# Patient Record
Sex: Male | Born: 1955 | Race: Black or African American | Hispanic: No | Marital: Married | Smoking: Never smoker
Health system: Southern US, Community
[De-identification: ages and names within clinical notes are randomized; demographics above are authoritative.]

## PROBLEM LIST (undated history)

## (undated) DIAGNOSIS — F419 Anxiety disorder, unspecified: Secondary | ICD-10-CM

## (undated) DIAGNOSIS — H269 Unspecified cataract: Secondary | ICD-10-CM

## (undated) DIAGNOSIS — T7840XA Allergy, unspecified, initial encounter: Secondary | ICD-10-CM

## (undated) DIAGNOSIS — H8110 Benign paroxysmal vertigo, unspecified ear: Secondary | ICD-10-CM

## (undated) DIAGNOSIS — E785 Hyperlipidemia, unspecified: Secondary | ICD-10-CM

## (undated) HISTORY — DX: Unspecified cataract: H26.9

## (undated) HISTORY — DX: Allergy, unspecified, initial encounter: T78.40XA

## (undated) HISTORY — DX: Anxiety disorder, unspecified: F41.9

## (undated) HISTORY — PX: EYE SURGERY: SHX253

## (undated) HISTORY — DX: Hyperlipidemia, unspecified: E78.5

## (undated) HISTORY — PX: COLONOSCOPY: SHX174

## (undated) HISTORY — DX: Benign paroxysmal vertigo, unspecified ear: H81.10

---

## 2012-01-09 HISTORY — PX: COLONOSCOPY: SHX174

## 2012-08-20 LAB — HM COLONOSCOPY

## 2016-08-17 ENCOUNTER — Other Ambulatory Visit: Payer: Self-pay

## 2016-08-17 ENCOUNTER — Encounter: Payer: Self-pay | Admitting: Family Medicine

## 2016-08-29 ENCOUNTER — Ambulatory Visit (INDEPENDENT_AMBULATORY_CARE_PROVIDER_SITE_OTHER): Payer: 59 | Admitting: Family Medicine

## 2016-08-29 ENCOUNTER — Encounter: Payer: Self-pay | Admitting: Family Medicine

## 2016-08-29 VITALS — BP 124/80 | HR 65 | Resp 12 | Ht 71.75 in | Wt 178.2 lb

## 2016-08-29 DIAGNOSIS — N529 Male erectile dysfunction, unspecified: Secondary | ICD-10-CM | POA: Diagnosis not present

## 2016-08-29 DIAGNOSIS — L700 Acne vulgaris: Secondary | ICD-10-CM | POA: Diagnosis not present

## 2016-08-29 DIAGNOSIS — E785 Hyperlipidemia, unspecified: Secondary | ICD-10-CM | POA: Diagnosis not present

## 2016-08-29 DIAGNOSIS — F419 Anxiety disorder, unspecified: Secondary | ICD-10-CM

## 2016-08-29 DIAGNOSIS — R079 Chest pain, unspecified: Secondary | ICD-10-CM

## 2016-08-29 DIAGNOSIS — H8110 Benign paroxysmal vertigo, unspecified ear: Secondary | ICD-10-CM

## 2016-08-29 MED ORDER — DIAZEPAM 5 MG PO TABS: 5.0000 mg | ORAL_TABLET | Freq: Two times a day (BID) | ORAL | 0 refills | Status: DC | PRN

## 2016-08-29 MED ORDER — CLINDAMYCIN PHOS-BENZOYL PEROX 1-5 % EX GEL: 1.0000 "application " | CUTANEOUS | 1 refills | Status: DC | PRN

## 2016-08-29 NOTE — Progress Notes (Signed)
HPI:   Franklin Baker is a 60 y.o. male, who is here today to establish care.  Former PCP: N/A, he just moved from [place] a month ago.  Last preventive routine visit: 1-2 years ago, 04/2015.  Chronic medical problems: HLD,ED, anxiety,vertogo,allergic rhinitis.  He has Hx of vertigo and anxiety, he is on Valium 5 mg bid as needed (mainly for anxiety). He has been on this med for about 3 years. He was following with neuro in [place]. He has not had an episode of vertigo for a few months.  According to pt, he had extensive work-up done, including brain MRI and negative otherwise. He denies hearing loss or tinnitus.  Vit D deficiency, he is on Vit D 2000 U daily.  "Ingrown hair bumps", has had problem intermittently for years. He usse topical abx as needed.  ED: He has already established with urologists in town.  According to pt, he was in the ER recently c/o chest pain (06/2016). Pain on right-sided chest started about 3 days after helping his brother to move, lifted heavy furniture. Denies any trauma. No associated dyspnea,diaphoresis, or palpitation. Pain was not radiated.  He stopped lifting wts, pain has resolved. He wonders if he can start exercising again.  HLD:  Currently he is on Zocor 40 mg daily. Tolerating medication well. He follows a healthy diet.  Last lab work in 06/2016 per pt report.  Concerns today: Med refills.   Review of Systems  Constitutional: Negative for activity change, appetite change, fatigue, fever and unexpected weight change.  HENT: Negative for congestion, hearing loss, nosebleeds, sore throat, tinnitus and trouble swallowing.   Eyes: Negative for redness and visual disturbance.  Respiratory: Negative for cough, shortness of breath and wheezing.   Cardiovascular: Negative for chest pain, palpitations and leg swelling.  Gastrointestinal: Negative for abdominal pain, nausea and vomiting.  Endocrine: Negative for polydipsia, polyphagia and  polyuria.  Genitourinary: Negative for decreased urine volume, dysuria and hematuria.  Musculoskeletal: Negative for back pain and gait problem.  Skin: Negative for pallor and rash.  Allergic/Immunologic: Positive for environmental allergies.  Neurological: Negative for dizziness, syncope, weakness, numbness and headaches.  Psychiatric/Behavioral: Negative for confusion. The patient is nervous/anxious.     No current outpatient prescriptions on file prior to visit.   No current facility-administered medications on file prior to visit.     Past Medical History:  Diagnosis Date  . Allergy   . Anxiety   . Hyperlipidemia   . Vertigo, benign positional    No Known Allergies  Family History  Problem Relation Age of Onset  . Cancer Mother     Social History   Social History  . Marital status: Married    Spouse name: N/A  . Number of children: N/A  . Years of education: N/A   Social History Main Topics  . Smoking status: Never Smoker  . Smokeless tobacco: Never Used  . Alcohol use Yes  . Drug use: No  . Sexual activity: Not Asked   Other Topics Concern  . None   Social History Narrative  . None    Vitals:   08/29/16 0822  BP: 124/80  Pulse: 65  Resp: 12  SpO2: 97%    Body mass index is 24.34 kg/m.   Physical Exam  Nursing note and vitals reviewed. Constitutional: He is oriented to person, place, and time. He appears well-developed and well-nourished. No distress.  HENT:  Head: Atraumatic.  Right Ear: Hearing, external  ear and ear canal normal. Tympanic membrane is scarred.  Left Ear: Hearing, tympanic membrane, external ear and ear canal normal.  Mouth/Throat: Oropharynx is clear and moist and mucous membranes are normal.  Eyes: Pupils are equal, round, and reactive to light. Conjunctivae and EOM are normal.  Cardiovascular: Normal rate and regular rhythm.   No murmur heard. Pulses:      Dorsalis pedis pulses are 2+ on the right side, and 2+ on the  left side.  Respiratory: Effort normal and breath sounds normal. No respiratory distress. He exhibits no tenderness.  GI: Soft. He exhibits no mass. There is no hepatomegaly. There is no tenderness.  Musculoskeletal: He exhibits no edema.  Lymphadenopathy:    He has no cervical adenopathy.  Neurological: He is alert and oriented to person, place, and time. He has normal strength. Coordination and gait normal.  Skin: Skin is warm. No erythema.  Psychiatric: His mood appears anxious.  Well groomed, poor eye contact.     ASSESSMENT AND PLAN:   Mr. Jeremaiah was seen today for establish care.  Diagnoses and all orders for this visit:  Hyperlipidemia, unspecified hyperlipidemia type  No changes in Zocor. Continue low fat diet and regular exercise. Last lab in 06/2016.  Will check FLP in 4 months.  Erectile dysfunction, unspecified erectile dysfunction type  He has appt with urologists. No changes in Viagra.  Benign paroxysmal positional vertigo, unspecified laterality  Asymptomatic. We discussed Dx and prognosis as well as treatment options. Instructed about warning signs.  -     diazepam (VALIUM) 5 MG tablet; Take 1 tablet (5 mg total) by mouth every 12 (twelve) hours as needed for anxiety. No more than 40 tab per month.  Acne vulgaris  Well controlled. No changes in current management. F/U in a year.  -     clindamycin-benzoyl peroxide (BENZACLIN) gel; Apply 1 application topically as needed.  Anxiety disorder, unspecified type  Stable. I agree with continue prescribing Valium. Side effects discussed. F/U in 3-4 months.  -     diazepam (VALIUM) 5 MG tablet; Take 1 tablet (5 mg total) by mouth every 12 (twelve) hours as needed for anxiety. No more than 40 tab per month.  Right-sided chest pain  Resolved. Most likely musculoskeletal. He can resume wt lifting as tolerated.    At the time of this visit I do not have records from former PCP>    Talor Desrosiers G. [place],  MD  [facility]. [place] office.

## 2016-08-29 NOTE — Patient Instructions (Signed)
A few things to remember from today's visit:   Erectile dysfunction, unspecified erectile dysfunction type  Benign paroxysmal positional vertigo, unspecified laterality  Anxiety disorder, unspecified type  No changes today. Please call 2 days in advance when due for Valium prescription.   Please be sure medication list is accurate. If a new problem present, please set up appointment sooner than planned today.

## 2016-09-01 ENCOUNTER — Encounter: Payer: Self-pay | Admitting: Family Medicine

## 2016-09-01 DIAGNOSIS — E785 Hyperlipidemia, unspecified: Secondary | ICD-10-CM | POA: Insufficient documentation

## 2016-09-01 DIAGNOSIS — L700 Acne vulgaris: Secondary | ICD-10-CM | POA: Insufficient documentation

## 2016-09-27 ENCOUNTER — Encounter: Payer: Self-pay | Admitting: Family Medicine

## 2016-10-08 ENCOUNTER — Telehealth: Payer: Self-pay | Admitting: Family Medicine

## 2016-10-08 DIAGNOSIS — F419 Anxiety disorder, unspecified: Secondary | ICD-10-CM

## 2016-10-08 DIAGNOSIS — E785 Hyperlipidemia, unspecified: Secondary | ICD-10-CM

## 2016-10-08 DIAGNOSIS — N529 Male erectile dysfunction, unspecified: Secondary | ICD-10-CM

## 2016-10-08 DIAGNOSIS — L700 Acne vulgaris: Secondary | ICD-10-CM

## 2016-10-08 DIAGNOSIS — H8110 Benign paroxysmal vertigo, unspecified ear: Secondary | ICD-10-CM

## 2016-10-08 NOTE — Telephone Encounter (Signed)
Pt need new Rx for diazepam (VALIUM) 5 MG  Pt is aware of 3 business days for refills and someone will call when ready for pick up.   Pt need new Rx for simvastatin 40 MG  #90  And sildenafil 100 MG  #18 clindamycin-benzoyl peroxide gel 1.5% (3 months) Pharm:  [facility] [address]

## 2016-10-08 NOTE — Telephone Encounter (Signed)
Okay to refill Diazepam?

## 2016-10-10 MED ORDER — DIAZEPAM 5 MG PO TABS: 5.0000 mg | ORAL_TABLET | Freq: Two times a day (BID) | ORAL | 1 refills | Status: DC | PRN

## 2016-10-10 MED ORDER — SIMVASTATIN 40 MG PO TABS: 40.0000 mg | ORAL_TABLET | Freq: Every day | ORAL | 1 refills | Status: DC

## 2016-10-10 MED ORDER — CLINDAMYCIN PHOS-BENZOYL PEROX 1-5 % EX GEL: 1.0000 "application " | CUTANEOUS | 2 refills | Status: DC | PRN

## 2016-10-10 MED ORDER — SILDENAFIL CITRATE 100 MG PO TABS: 100.0000 mg | ORAL_TABLET | Freq: Every day | ORAL | 2 refills | Status: DC | PRN

## 2016-10-10 NOTE — Telephone Encounter (Signed)
Diazepam 5 mg can be called in to continue taking 1 tab bid as needed. No more than 40 tabs for 30 days. #40/1.  Thanks, BJ

## 2016-10-10 NOTE — Telephone Encounter (Signed)
I called and spoke with patient to let him know that the Rx for the Diazepam can be called in instead of him having to come pick up a script. Patient is in agreement with this plan instead. All of the Rx's have been sent in to the [facility] on [address]. The Rx for Diazepam was called into the pharmacy's voicemail.

## 2016-12-27 NOTE — Progress Notes (Signed)
HPI:  [name] is a 61 y.o.male here today for his routine physical examination.  Last CPE: 2014.  He lives alone, his wife is still living in [place] and planning on moving here once she retires.   Regular exercise 3 or more times per week: Yes, he cardio and push ups. Following a healthy diet: Yes.  He works 16 hours per [employer],part time.  His daughter is in college and stays with him during school brakes.   Chronic medical problems: ED,anxiety,HLD, Vit D def, and vertigo among some. He is on Valium 5 mg bid prn, has taking it for 3-4 years. He takes medication occasionally.  HLD: On Zocor 40 mg daily. He also follows a low fat diet. He had labs done in 04/2016: TC 173,HDL 51,LDL 94,and TG 140. CMP in normal range. GGT 35. Hep B,C,and A screening negative.   Hx of STD's: Denies.  Immunization History  Administered Date(s) Administered  . Influenza,inj,Quad PF,6+ Mos 12/28/2016    -Hep C screening: 05/2016 NR.   Last colon cancer screening: Per pt report, he is due 08/2017 (5 years FU).  Last prostate ca screening: He is following with urologist, last visit 09/2016.  Last eye exam about 3 weeks ago.  -Denies high alcohol intake, tobacco use, or Hx of illicit drug use.  He has no concerns today.     Review of Systems  Constitutional: Negative for activity change, appetite change, fatigue, fever and unexpected weight change.  HENT: Negative for dental problem, nosebleeds, sore throat, trouble swallowing and voice change.   Eyes: Negative for redness and visual disturbance.  Respiratory: Negative for cough, shortness of breath and wheezing.   Cardiovascular: Negative for chest pain, palpitations and leg swelling.  Gastrointestinal: Negative for abdominal pain, blood in stool, nausea and vomiting.  Endocrine: Negative for cold intolerance, heat intolerance, polydipsia, polyphagia and polyuria.  Genitourinary: Negative for decreased urine volume, dysuria,  genital sores, hematuria and testicular pain.  Musculoskeletal: Negative for gait problem and myalgias.  Skin: Negative for color change and rash.  Allergic/Immunologic: Positive for environmental allergies.  Neurological: Positive for dizziness (at his baseline). Negative for syncope, weakness and headaches.  Hematological: Negative for adenopathy. Does not bruise/bleed easily.  Psychiatric/Behavioral: Negative for confusion, sleep disturbance and suicidal ideas. The patient is nervous/anxious.   All other systems reviewed and are negative.    Current Outpatient Medications on File Prior to Visit  Medication Sig Dispense Refill  . Cholecalciferol (VITAMIN D) 2000 units CAPS Take 1 capsule by mouth daily.    . clindamycin-benzoyl peroxide (BENZACLIN) gel Apply 1 application topically as needed. 135 g 2  . fluticasone (FLONASE) 50 MCG/ACT nasal spray Place 1 spray into both nostrils daily.    . Omega-3 Fatty Acids (FISH OIL TRIPLE STRENGTH) 1400 MG CAPS Take 1 capsule by mouth daily.    . sildenafil (VIAGRA) 100 MG tablet Take 1 tablet (100 mg total) by mouth daily as needed for erectile dysfunction. 18 tablet 2   No current facility-administered medications on file prior to visit.      Past Medical History:  Diagnosis Date  . Allergy   . Anxiety   . Hyperlipidemia   . Vertigo, benign positional     History reviewed. No pertinent surgical history.  No Known Allergies  Family History  Problem Relation Age of Onset  . Cancer Mother     Social History   Socioeconomic History  . Marital status: Married  Spouse name: None  . Number of children: None  . Years of education: None  . Highest education level: None  Social Needs  . Financial resource strain: None  . Food insecurity - worry: None  . Food insecurity - inability: None  . Transportation needs - medical: None  . Transportation needs - non-medical: None  Occupational History  . None  Tobacco Use  . Smoking  status: Never Smoker  . Smokeless tobacco: Never Used  Substance and Sexual Activity  . Alcohol use: Yes  . Drug use: No  . Sexual activity: None  Other Topics Concern  . None  Social History Narrative  . None     Vitals:   12/28/16 0800  BP: 132/84  Pulse: 72  Resp: 12  Temp: 98 F (36.7 C)  SpO2: 98%   Body mass index is 25.61 kg/m.   Wt Readings from Last 3 Encounters:  12/28/16 187 lb 8 oz (85 kg)  08/29/16 178 lb 4 oz (80.9 kg)    Physical Exam  Nursing note and vitals reviewed. Constitutional: He is oriented to person, place, and time. He appears well-developed and well-nourished. No distress.  HENT:  Head: Normocephalic and atraumatic.  Right Ear: Hearing, tympanic membrane, external ear and ear canal normal.  Left Ear: Hearing and external ear normal.  Mouth/Throat: Oropharynx is clear and moist and mucous membranes are normal.  Excess cerumen L>R. I cannot see left TM.  Eyes: Conjunctivae and EOM are normal. Pupils are equal, round, and reactive to light.  Neck: Normal range of motion. No thyroid mass and no thyromegaly present.  Cardiovascular: Normal rate and regular rhythm.  No murmur heard. Pulses:      Dorsalis pedis pulses are 2+ on the right side, and 2+ on the left side.  Respiratory: Effort normal and breath sounds normal. No respiratory distress.  GI: Soft. He exhibits no mass. There is no tenderness.  Genitourinary:  Genitourinary Comments: Deferred to urologist.  Musculoskeletal: He exhibits no edema or tenderness.  No major deformities appreciated and no signs of synovitis.  Lymphadenopathy:    He has no cervical adenopathy.       Right: No supraclavicular adenopathy present.       Left: No supraclavicular adenopathy present.  Neurological: He is alert and oriented to person, place, and time. He has normal strength. No cranial nerve deficit or sensory deficit. Coordination and gait normal.  Reflex Scores:      Bicep reflexes are 2+ on  the right side and 2+ on the left side.      Patellar reflexes are 2+ on the right side and 2+ on the left side. Skin: Skin is warm. No rash noted. No erythema.  Psychiatric: He has a normal mood and affect.    ASSESSMENT AND PLAN:  [name] was seen today for annual exam.  Diagnoses and all orders for this visit:  Routine general medical examination at a health care facility  We discussed the importance of regular physical activity and healthy diet for prevention of chronic illness and/or complications. Preventive guidelines reviewed. Vaccination up to date.   Next CPE in a year.  Diabetes mellitus screening -     POCT glycosylated hemoglobin (Hb A1C)  Hyperlipidemia, unspecified hyperlipidemia type  Well controlled. He would like to try decreasing Simvastatin from 40 mg to 20 mg. Continue low fat diet. F/U in 4-6 months.   -     simvastatin (ZOCOR) 20 MG tablet; Take 1 tablet (20  mg total) by mouth at bedtime.  Benign paroxysmal positional vertigo, unspecified laterality  Stable. No changes in current management,some side effects of Valium discussed. F/U in 6 months.  -     Diazepam (VALIUM) 5 MG tablet; Take 1 tablet (5 mg total) by mouth every 12 (twelve) hours as needed for anxiety. No more than 40 tab per month.   Anxiety disorder, unspecified type  Stable. No changes in Diazepam. He is not interested in daily anxiolytic medication. F/U in 6 months.       Diazepam (VALIUM) 5 MG tablet; Take 1 tablet (5 mg total) by mouth every 12 (twelve) hours as needed for anxiety. No more than 40 tab per month.  Need for influenza vaccination -     Flu Vaccine QUAD 36+ mos IM    Next OV if anxiety and vertigo stable + 2 refills last all year we will consider following annually.     Return in 6 months (on 06/28/2017) for HLD, anxiety.    Santhosh Gulino G. [place], MD  [facility]. [place] office.

## 2016-12-28 ENCOUNTER — Ambulatory Visit (INDEPENDENT_AMBULATORY_CARE_PROVIDER_SITE_OTHER): Payer: No Typology Code available for payment source | Admitting: Family Medicine

## 2016-12-28 ENCOUNTER — Encounter: Payer: Self-pay | Admitting: Family Medicine

## 2016-12-28 VITALS — BP 132/84 | HR 72 | Temp 98.0°F | Resp 12 | Ht 71.75 in | Wt 187.5 lb

## 2016-12-28 DIAGNOSIS — Z0000 Encounter for general adult medical examination without abnormal findings: Secondary | ICD-10-CM | POA: Diagnosis not present

## 2016-12-28 DIAGNOSIS — E785 Hyperlipidemia, unspecified: Secondary | ICD-10-CM | POA: Diagnosis not present

## 2016-12-28 DIAGNOSIS — H8110 Benign paroxysmal vertigo, unspecified ear: Secondary | ICD-10-CM | POA: Diagnosis not present

## 2016-12-28 DIAGNOSIS — Z131 Encounter for screening for diabetes mellitus: Secondary | ICD-10-CM | POA: Diagnosis not present

## 2016-12-28 DIAGNOSIS — Z23 Encounter for immunization: Secondary | ICD-10-CM | POA: Diagnosis not present

## 2016-12-28 DIAGNOSIS — F419 Anxiety disorder, unspecified: Secondary | ICD-10-CM

## 2016-12-28 LAB — POCT GLYCOSYLATED HEMOGLOBIN (HGB A1C): Hemoglobin A1C: 5.6

## 2016-12-28 MED ORDER — DIAZEPAM 5 MG PO TABS: 5.0000 mg | ORAL_TABLET | Freq: Two times a day (BID) | ORAL | 1 refills | Status: DC | PRN

## 2016-12-28 MED ORDER — SIMVASTATIN 20 MG PO TABS: 20.0000 mg | ORAL_TABLET | Freq: Every day | ORAL | 2 refills | Status: DC

## 2016-12-28 NOTE — Patient Instructions (Addendum)
A few things to remember from today's visit:   Routine general medical examination at a health care facility  Encounter for HCV screening test for high risk patient  Diabetes mellitus screening  Hyperlipidemia, unspecified hyperlipidemia type - Plan: simvastatin (ZOCOR) 20 MG tablet  Benign paroxysmal positional vertigo, unspecified laterality - Plan: diazepam (VALIUM) 5 MG tablet  Anxiety disorder, unspecified type - Plan: diazepam (VALIUM) 5 MG tablet   At least 150 minutes of moderate exercise per week, daily brisk walking for 15-30 min is a good exercise option. Healthy diet low in saturated (animal) fats and sweets and consisting of fresh fruits and vegetables, lean meats such as fish and white chicken and whole grains.  - Vaccines:  Tdap vaccine every 10 years.  Shingles vaccine recommended at age 59, could be given after 59 years of age but not sure about insurance coverage.  Pneumonia vaccines:  Prevnar 13 at 65 and Pneumovax at 3066.   -Screening recommendations for low/normal risk males:  Screening for diabetes at age 59 and every 3 years. Earlier screening if cardiovascular risk factors.   Lipid screening at 35 and every 3 years. Screening starts in younger males with cardiovascular risk factors.  Colon cancer screening at age 59 and until age 59.  Prostate cancer screening: some controversy, starts usually at 50: Rectal exam and PSA.  Aortic Abdominal Aneurism once between 2165 and 59 years old if ever smoker.  Also recommended:  1. Dental visit- Brush and floss your teeth twice daily; visit your dentist twice a year. 2. Eye doctor- Get an eye exam at least every 2 years. 3. Helmet use- Always wear a helmet when riding a bicycle, motorcycle, rollerblading or skateboarding. 4. Safe sex- If you may be exposed to sexually transmitted infections, use a condom. 5. Seat belts- Seat belts can save your live; always wear one. 6. Smoke/Carbon Monoxide detectors- These  detectors need to be installed on the appropriate level of your home. Replace batteries at least once a year. 7. Skin cancer- When out in the sun please cover up and use sunscreen 15 SPF or higher. 8. Violence- If anyone is threatening or hurting you, please tell your healthcare provider.  9. Drink alcohol in moderation- Limit alcohol intake to one drink or less per day. Never drink and drive.  Please be sure medication list is accurate. If a new problem present, please set up appointment sooner than planned today.

## 2017-01-08 HISTORY — PX: COLONOSCOPY: SHX174

## 2017-01-22 ENCOUNTER — Other Ambulatory Visit: Payer: Self-pay | Admitting: *Deleted

## 2017-01-22 ENCOUNTER — Telehealth: Payer: Self-pay | Admitting: Family Medicine

## 2017-01-22 DIAGNOSIS — L700 Acne vulgaris: Secondary | ICD-10-CM

## 2017-01-22 MED ORDER — CLINDAMYCIN PHOS-BENZOYL PEROX 1-5 % EX GEL: 1.0000 "application " | CUTANEOUS | 2 refills | Status: DC | PRN

## 2017-01-22 NOTE — Telephone Encounter (Signed)
Copied from CRM [id number]. Topic: Quick Communication - See Telephone Encounter >> Jan 22, 2017 10:35 AM Guinevere FerrariMorris, Hamda Klutts E, NT wrote: CRM for notification. See Telephone encounter for: Patient called and said that the he submitted a refill request  for clindamycin-benzoyl peroxide (BENZACLIN) gel but the pharmacy said the company is out and they don't know when the medication will be in. [facility] [address]   01/22/17.

## 2017-01-22 NOTE — Telephone Encounter (Signed)
Request change in pharmacy

## 2017-01-22 NOTE — Telephone Encounter (Signed)
Rx sent to pharmacy   

## 2017-03-27 ENCOUNTER — Other Ambulatory Visit: Payer: Self-pay | Admitting: Family Medicine

## 2017-03-27 DIAGNOSIS — H8110 Benign paroxysmal vertigo, unspecified ear: Secondary | ICD-10-CM

## 2017-03-27 DIAGNOSIS — F419 Anxiety disorder, unspecified: Secondary | ICD-10-CM

## 2017-04-15 ENCOUNTER — Ambulatory Visit (INDEPENDENT_AMBULATORY_CARE_PROVIDER_SITE_OTHER)
Admission: RE | Admit: 2017-04-15 | Discharge: 2017-04-15 | Disposition: A | Discharge status: Home / Self Care | Payer: No Typology Code available for payment source | Source: Ambulatory Visit | Attending: Family Medicine | Admitting: Family Medicine

## 2017-04-15 ENCOUNTER — Encounter: Payer: Self-pay | Admitting: Family Medicine

## 2017-04-15 ENCOUNTER — Ambulatory Visit: Payer: No Typology Code available for payment source | Admitting: Family Medicine

## 2017-04-15 VITALS — BP 124/80 | HR 72 | Temp 98.2°F | Resp 12 | Ht 71.75 in | Wt 185.0 lb

## 2017-04-15 DIAGNOSIS — M549 Dorsalgia, unspecified: Secondary | ICD-10-CM | POA: Diagnosis not present

## 2017-04-15 DIAGNOSIS — R079 Chest pain, unspecified: Secondary | ICD-10-CM

## 2017-04-15 MED ORDER — TIZANIDINE HCL 2 MG PO CAPS: 2.0000 mg | ORAL_CAPSULE | Freq: Three times a day (TID) | ORAL | 0 refills | Status: AC | PRN

## 2017-04-15 NOTE — Patient Instructions (Signed)
A few things to remember from today's visit:   Right-sided chest pain - Plan: DG Chest 2 View, tizanidine (ZANAFLEX) 2 MG capsule  Upper back pain on right side - Plan: DG Thoracic Spine W/Swimmers, tizanidine (ZANAFLEX) 2 MG capsule  I still think pain is muscle related.  He does not seem to be worrisome. We can consider physical therapy, please let me know if you are interested in doing so. Muscle relaxants can cause sleepiness, so take it at bedtime.  Please be sure medication list is accurate. If a new problem present, please set up appointment sooner than planned today.

## 2017-04-15 NOTE — Progress Notes (Signed)
ACUTE VISIT   HPI:  Chief Complaint  Patient presents with  . Neck Pain    back of neck that radiates to right shoulder, previous issue that doesn't seem to be getting better    [name] is a 62 y.o. male, who is here today complaining of over 3 months of intermittent right upper chest pain and a couple months of right upper back pain. No history of injury. No limitation of range of motion.  Right upper chest pain is right below right clavicle, achy, 5-6/10, no radiated. Upper back pain is also achy, intermittent, 5/10. He has not noted skin rash, numbness, or tingling.  He has no noted fever, chills, dyspnea, diaphoresis, wheezing, cough, or abnormal weight loss. He has not tried OTC analgesics, he has tried topical "sport" creams.  He is not sure about exacerbating or alleviating factors for right upper chest pain. Right upper back pain is exacerbated by sleeping on his right side. He has not identified alleviating factors.  Problems otherwise stable.   Review of Systems  Constitutional: Negative for chills, diaphoresis, fatigue, fever and unexpected weight change.  Respiratory: Negative for cough, shortness of breath and wheezing.   Cardiovascular: Negative for palpitations and leg swelling.  Gastrointestinal: Negative for abdominal pain, nausea and vomiting.  Musculoskeletal: Positive for back pain. Negative for gait problem and joint swelling.  Skin: Negative for rash.  Neurological: Negative for weakness, numbness and headaches.  Hematological: Negative for adenopathy. Does not bruise/bleed easily.  Psychiatric/Behavioral: Negative for confusion. The patient is nervous/anxious.       Current Outpatient Medications on File Prior to Visit  Medication Sig Dispense Refill  . Cholecalciferol (VITAMIN D) 2000 units CAPS Take 1 capsule by mouth daily.    . clindamycin-benzoyl peroxide (BENZACLIN) gel Apply 1 application topically as needed. 135 g 2  .  diazepam (VALIUM) 5 MG tablet Take 1 tablet (5 mg total) by mouth every 12 (twelve) hours as needed for anxiety. No more than 40 tabs per month 40 tablet 2  . fluticasone (FLONASE) 50 MCG/ACT nasal spray Place 1 spray into both nostrils daily.    . Omega-3 Fatty Acids (FISH OIL TRIPLE STRENGTH) 1400 MG CAPS Take 1 capsule by mouth daily.    . sildenafil (VIAGRA) 100 MG tablet Take 1 tablet (100 mg total) by mouth daily as needed for erectile dysfunction. 18 tablet 2  . simvastatin (ZOCOR) 20 MG tablet Take 1 tablet (20 mg total) by mouth at bedtime. 90 tablet 2   No current facility-administered medications on file prior to visit.      Past Medical History:  Diagnosis Date  . Allergy   . Anxiety   . Hyperlipidemia   . Vertigo, benign positional    No Known Allergies  Social History   Socioeconomic History  . Marital status: Married    Spouse name: Not on file  . Number of children: Not on file  . Years of education: Not on file  . Highest education level: Not on file  Occupational History  . Not on file  Social Needs  . Financial resource strain: Not on file  . Food insecurity:    Worry: Not on file    Inability: Not on file  . Transportation needs:    Medical: Not on file    Non-medical: Not on file  Tobacco Use  . Smoking status: Never Smoker  . Smokeless tobacco: Never Used  Substance and Sexual Activity  .  Alcohol use: Yes  . Drug use: No  . Sexual activity: Not on file  Lifestyle  . Physical activity:    Days per week: Not on file    Minutes per session: Not on file  . Stress: Not on file  Relationships  . Social connections:    Talks on phone: Not on file    Gets together: Not on file    Attends religious service: Not on file    Active member of club or organization: Not on file    Attends meetings of clubs or organizations: Not on file    Relationship status: Not on file  Other Topics Concern  . Not on file  Social History Narrative  . Not on file     Vitals:   04/15/17 1402  BP: 124/80  Pulse: 72  Resp: 12  Temp: 98.2 F (36.8 C)  SpO2: 98%   Body mass index is 25.27 kg/m.   Physical Exam  Nursing note and vitals reviewed. Constitutional: He is oriented to person, place, and time. He appears well-developed and well-nourished. No distress.  HENT:  Head: Normocephalic and atraumatic.  Mouth/Throat: Oropharynx is clear and moist and mucous membranes are normal.  Eyes: Conjunctivae and EOM are normal.  Cardiovascular: Normal rate and regular rhythm.  No murmur heard. Respiratory: Effort normal and breath sounds normal. No respiratory distress. He exhibits no tenderness.  Musculoskeletal: He exhibits no edema.       Cervical back: He exhibits decreased range of motion (mild decreased left rotation.). He exhibits no tenderness and no bony tenderness.       Thoracic back: He exhibits spasm. He exhibits normal range of motion, no tenderness and no bony tenderness.  No significant deformity appreciated. Right paraspinal muscle more prominent but not significant scoliosis appreciated. Normal range of motion of shoulders, bilateral, movement does not elicit pain.  No local edema or erythema appreciated, no suspicious lesions.    Lymphadenopathy:    He has no cervical adenopathy.       Right: No supraclavicular adenopathy present.       Left: No supraclavicular adenopathy present.  Neurological: He is alert and oriented to person, place, and time. He has normal strength. Gait normal.  Skin: Skin is warm. No rash noted. No erythema.  Psychiatric: His mood appears anxious.  Well groomed,good eye contact.      ASSESSMENT AND PLAN:   [name] was seen today for neck pain.  Diagnoses and all orders for this visit:  Right-sided chest pain  We discussed possible causes, history and examination do not suggest a serious illness. I think it is musculoskeletal, even though pain was not reproducible on examination today. He  would like imaging done. Zanaflex might help.  -     DG Chest 2 View; Future -     tizanidine (ZANAFLEX) 2 MG capsule; Take 1 capsule (2 mg total) by mouth 3 (three) times daily as needed for muscle spasms.  Upper back pain on right side  Seems to be muscle related. Thoracic imaging will be arranged. Some side effects of Zanaflex discussed. We can consider PT, he will let me know if he is interested in doing so.  -     DG Thoracic Spine W/Swimmers; Future -     tizanidine (ZANAFLEX) 2 MG capsule; Take 1 capsule (2 mg total) by mouth 3 (three) times daily as needed for muscle spasms.      -[name] was advised to seek immediate  medical attention if sudden worsening symptoms or to follow if they persist or if new concerns arise.       Ariadna Setter G. [place], MD  [facility]. [place] office.

## 2017-04-18 ENCOUNTER — Telehealth: Payer: Self-pay | Admitting: Family Medicine

## 2017-04-18 NOTE — Telephone Encounter (Signed)
Copied from CRM [phone]. Topic: Quick Communication - Other Results >> Apr 18, 2017 11:38 AM Franklin Baker, Rosey Batheresa D wrote:  Patient called and would like his xray results from his OV this week. Please call patient back with results.

## 2017-04-19 NOTE — Telephone Encounter (Signed)
I left a detailed message at the pts cell number with the results and the results were also sent to the pt on 4/8 via Mychart message.

## 2017-05-30 ENCOUNTER — Ambulatory Visit: Payer: No Typology Code available for payment source | Admitting: Family Medicine

## 2017-05-30 ENCOUNTER — Encounter: Payer: Self-pay | Admitting: Family Medicine

## 2017-05-30 VITALS — BP 122/80 | HR 88 | Temp 98.4°F | Resp 12 | Ht 71.75 in | Wt 181.1 lb

## 2017-05-30 DIAGNOSIS — J069 Acute upper respiratory infection, unspecified: Secondary | ICD-10-CM | POA: Diagnosis not present

## 2017-05-30 DIAGNOSIS — J029 Acute pharyngitis, unspecified: Secondary | ICD-10-CM | POA: Diagnosis not present

## 2017-05-30 LAB — POCT RAPID STREP A (OFFICE): RAPID STREP A SCREEN: NEGATIVE

## 2017-05-30 MED ORDER — MAGIC MOUTHWASH W/LIDOCAINE: 5.0000 mL | Freq: Three times a day (TID) | ORAL | 0 refills | Status: AC | PRN

## 2017-05-30 MED ORDER — IBUPROFEN 600 MG PO TABS: 600.0000 mg | ORAL_TABLET | Freq: Three times a day (TID) | ORAL | 0 refills | Status: AC | PRN

## 2017-05-30 NOTE — Patient Instructions (Signed)
A few things to remember from today's visit:   Sore throat - Plan: POC Rapid Strep A, ibuprofen (ADVIL,MOTRIN) 600 MG tablet, magic mouthwash w/lidocaine SOLN  Acute pharyngitis, unspecified etiology   Symptomatic treatment: Over the counter Acetaminophen 500 mg and/or Ibuprofen (400-600 mg) if there is not contraindications; you can alternate in between both every 4-6 hours. Gargles with saline water and throat lozenges might also help. Cold fluids.    Seek prompt medical evaluation if you are having difficulty breathing, mouth swelling, throat closing up, not able to swallow liquids (drooling), skin rash/bruising, or worsening symptoms.  Please follow up in 2 weeks if not any better.    Please be sure medication list is accurate. If a new problem present, please set up appointment sooner than planned today.

## 2017-05-30 NOTE — Progress Notes (Signed)
ACUTE VISIT  HPI:  Chief Complaint  Patient presents with  . Cough    causing chest discomfort, sx started yesterday, took OTC theraflu  . Sore Throat    FranklinFranklin Baker is a 62 y.o.male here today complaining of 2 days of respiratory symptoms.  2 days ago he started with non-productive cough and yesterday with sore throat. Sore throat is exacerbated by swallowing. She thinks his wife was diagnosed with a strep recently, she was having similar symptoms and she was started on antibiotics. No stridor, dysphasia, wheezing, or dyspnea. He has not had fever, chills, or body aches. + Mild hoarseness.  He has been taking OTC TheraFlu.  Sore Throat   This is a new problem. The current episode started yesterday. The problem has been unchanged. There has been no fever. The pain is moderate. Associated symptoms include coughing, a hoarse voice and a plugged ear sensation. Pertinent negatives include no abdominal pain, diarrhea, ear discharge, ear pain, headaches, neck pain, shortness of breath, stridor, swollen glands, trouble swallowing or vomiting. He has had exposure to strep. He has had no exposure to mono. He has tried acetaminophen for the symptoms. The treatment provided mild relief.   No Hx of recent travel.   Symptoms otherwise stable.     Review of Systems  Constitutional: Positive for fatigue. Negative for activity change, appetite change, chills and fever.  HENT: Positive for hoarse voice, sore throat and voice change. Negative for ear discharge, ear pain, mouth sores, rhinorrhea, sneezing and trouble swallowing.   Eyes: Negative for discharge and redness.  Respiratory: Positive for cough. Negative for chest tightness, shortness of breath, wheezing and stridor.   Gastrointestinal: Negative for abdominal pain, diarrhea, nausea and vomiting.  Musculoskeletal: Negative for gait problem, myalgias and neck pain.  Skin: Negative for rash.  Allergic/Immunologic:  Negative for environmental allergies.  Neurological: Negative for weakness and headaches.  Hematological: Negative for adenopathy. Does not bruise/bleed easily.  Psychiatric/Behavioral: Negative for confusion. The patient is nervous/anxious.       Current Outpatient Medications on File Prior to Visit  Medication Sig Dispense Refill  . Cholecalciferol (VITAMIN D) 2000 units CAPS Take 1 capsule by mouth daily.    . clindamycin-benzoyl peroxide (BENZACLIN) gel Apply 1 application topically as needed. 135 g 2  . diazepam (VALIUM) 5 MG tablet Take 1 tablet (5 mg total) by mouth every 12 (twelve) hours as needed for anxiety. No more than 40 tabs per month 40 tablet 2  . Omega-3 Fatty Acids (FISH OIL TRIPLE STRENGTH) 1400 MG CAPS Take 1 capsule by mouth daily.    . sildenafil (VIAGRA) 100 MG tablet Take 1 tablet (100 mg total) by mouth daily as needed for erectile dysfunction. 18 tablet 2  . simvastatin (ZOCOR) 20 MG tablet Take 1 tablet (20 mg total) by mouth at bedtime. 90 tablet 2  . fluticasone (FLONASE) 50 MCG/ACT nasal spray Place 1 spray into both nostrils daily.     No current facility-administered medications on file prior to visit.      Past Medical History:  Diagnosis Date  . Allergy   . Anxiety   . Hyperlipidemia   . Vertigo, benign positional    No Known Allergies  Social History   Socioeconomic History  . Marital status: Married    Spouse name: Not on file  . Number of children: Not on file  . Years of education: Not on file  . Highest education level: Not on  file  Occupational History  . Not on file  Social Needs  . Financial resource strain: Not on file  . Food insecurity:    Worry: Not on file    Inability: Not on file  . Transportation needs:    Medical: Not on file    Non-medical: Not on file  Tobacco Use  . Smoking status: Never Smoker  . Smokeless tobacco: Never Used  Substance and Sexual Activity  . Alcohol use: Yes  . Drug use: No  . Sexual  activity: Not on file  Lifestyle  . Physical activity:    Days per week: Not on file    Minutes per session: Not on file  . Stress: Not on file  Relationships  . Social connections:    Talks on phone: Not on file    Gets together: Not on file    Attends religious service: Not on file    Active member of club or organization: Not on file    Attends meetings of clubs or organizations: Not on file    Relationship status: Not on file  Other Topics Concern  . Not on file  Social History Narrative  . Not on file    Vitals:   05/30/17 1110  BP: 122/80  Pulse: 88  Resp: 12  Temp: 98.4 F (36.9 C)  SpO2: 96%   Body mass index is 24.74 kg/m.   Physical Exam  Nursing note and vitals reviewed. Constitutional: He is oriented to person, place, and time. He appears well-developed and well-nourished. He does not appear ill. No distress.  HENT:  Head: Normocephalic and atraumatic.  Right Ear: Tympanic membrane, external ear and ear canal normal.  Left Ear: Tympanic membrane, external ear and ear canal normal.  Mouth/Throat: Uvula is midline and mucous membranes are normal. Posterior oropharyngeal erythema present.  No petechial rash appreciated.  Eyes: Pupils are equal, round, and reactive to light. Conjunctivae are normal.  Neck: No muscular tenderness present. No tracheal deviation present.  Cardiovascular: Normal rate and regular rhythm.  No murmur heard. Respiratory: Effort normal and breath sounds normal. No stridor. No respiratory distress.  Lymphadenopathy:       Head (right side): No submandibular adenopathy present.       Head (left side): No submandibular adenopathy present.    He has cervical adenopathy.       Right cervical: Posterior cervical adenopathy present.       Left cervical: Posterior cervical adenopathy present.  Neurological: He is alert and oriented to person, place, and time. He has normal strength.  Skin: Skin is warm. No rash noted. No erythema.    Psychiatric: He has a normal mood and affect. His speech is normal.  Well groomed, good eye contact.    ASSESSMENT AND PLAN:   Franklin Baker was seen today for cough and sore throat.  Diagnoses and all orders for this visit:  URI, acute  Symptoms seem to be viral, so for now symptomatic treatment were recommended.  Sore throat -     POC Rapid Strep A -     ibuprofen (ADVIL,MOTRIN) 600 MG tablet; Take 1 tablet (600 mg total) by mouth every 8 (eight) hours as needed for up to 5 days. -     magic mouthwash w/lidocaine SOLN; Take 5 mLs by mouth 3 (three) times daily as needed for up to 10 days for mouth pain. 50 ml of diphenhydramine, alum and mag hydroxide, and lidocaine to make 150 ml  Acute pharyngitis, unspecified  etiology  We discussed different etiologies, on examination it does not seem to be strep, rapid strep done today was negative. No clear about strep exposure. For now recommend to wait until culture is back and treat accordingly. Symptomatic treatment with ibuprofen 600 mg 3 times daily for 5 days and Magic mouthwash with lidocaine were recommended. Follow-up as needed.     Franklin Arizmendi G. [place], MD  [facility]. [place] office.

## 2017-05-30 NOTE — Addendum Note (Signed)
Addended by: [place], BETTY G on: 05/30/2017 03:43 PM   Modules accepted: Orders

## 2017-06-01 ENCOUNTER — Encounter: Payer: Self-pay | Admitting: Family Medicine

## 2017-06-01 LAB — CULTURE, GROUP A STREP
MICRO NUMBER: 90628010
SPECIMEN QUALITY: ADEQUATE

## 2017-07-01 ENCOUNTER — Encounter: Payer: Self-pay | Admitting: Family Medicine

## 2017-07-01 ENCOUNTER — Ambulatory Visit: Payer: No Typology Code available for payment source | Admitting: Family Medicine

## 2017-07-01 VITALS — BP 132/78 | HR 68 | Temp 98.1°F | Resp 12 | Ht 71.75 in | Wt 181.6 lb

## 2017-07-01 DIAGNOSIS — Z1211 Encounter for screening for malignant neoplasm of colon: Secondary | ICD-10-CM

## 2017-07-01 DIAGNOSIS — H8110 Benign paroxysmal vertigo, unspecified ear: Secondary | ICD-10-CM

## 2017-07-01 DIAGNOSIS — Z1159 Encounter for screening for other viral diseases: Secondary | ICD-10-CM | POA: Diagnosis not present

## 2017-07-01 DIAGNOSIS — E785 Hyperlipidemia, unspecified: Secondary | ICD-10-CM

## 2017-07-01 DIAGNOSIS — F419 Anxiety disorder, unspecified: Secondary | ICD-10-CM | POA: Diagnosis not present

## 2017-07-01 DIAGNOSIS — Z9189 Other specified personal risk factors, not elsewhere classified: Secondary | ICD-10-CM | POA: Diagnosis not present

## 2017-07-01 LAB — COMPREHENSIVE METABOLIC PANEL
ALBUMIN: 4.4 g/dL (ref 3.5–5.2)
ALK PHOS: 67 U/L (ref 39–117)
ALT: 14 U/L (ref 0–53)
AST: 17 U/L (ref 0–37)
BILIRUBIN TOTAL: 0.6 mg/dL (ref 0.2–1.2)
BUN: 15 mg/dL (ref 6–23)
CALCIUM: 9.7 mg/dL (ref 8.4–10.5)
CHLORIDE: 104 meq/L (ref 96–112)
CO2: 28 mEq/L (ref 19–32)
CREATININE: 1.1 mg/dL (ref 0.40–1.50)
GFR: 87.92 mL/min (ref >60.00)
Glucose, Bld: 102 mg/dL — ABNORMAL HIGH (ref 70–99)
Potassium: 4.5 mEq/L (ref 3.5–5.1)
Sodium: 141 mEq/L (ref 135–145)
TOTAL PROTEIN: 7 g/dL (ref 6.0–8.3)

## 2017-07-01 LAB — LIPID PANEL
CHOLESTEROL: 213 mg/dL — AB (ref 0–200)
HDL: 51.2 mg/dL (ref >39.00)
LDL CALC: 142 mg/dL — AB (ref 0–99)
NonHDL: 162.27
TRIGLYCERIDES: 101 mg/dL (ref 0.0–149.0)
Total CHOL/HDL Ratio: 4
VLDL: 20.2 mg/dL (ref 0.0–40.0)

## 2017-07-01 NOTE — Assessment & Plan Note (Signed)
No changes in current management, will follow labs done today and will give further recommendations accordingly. Follow-up in 6 to 12 months depending of lipid panel results. 

## 2017-07-01 NOTE — Patient Instructions (Signed)
A few things to remember from today's visit:   Hyperlipidemia, unspecified hyperlipidemia type - Plan: Comprehensive metabolic panel, Lipid panel  Anxiety disorder, unspecified type  Benign paroxysmal positional vertigo, unspecified laterality   Please be sure medication list is accurate. If a new problem present, please set up appointment sooner than planned today.

## 2017-07-01 NOTE — Assessment & Plan Note (Signed)
Stable. He will continue Valium 5 mg daily as needed. Follow-up in 5 months.

## 2017-07-01 NOTE — Assessment & Plan Note (Addendum)
He is still having some episodes, otherwise stable. Fall precautions. No changes in Valium dose.

## 2017-07-01 NOTE — Progress Notes (Signed)
HPI:   [name] is a 62 y.o. male, who is here today for 6 months follow up.   He was last seen in 05/2017 for acute visit,URI.  Anxiety: He is on Valium 5 mg bid prn,which also helps with chronic vertigo. In general he is taking Valium 5 mg daily, he takes a second dose as needed. Denies depressed mood or suicidal thoughts.    He has been on medication for about 4 years.  Vertigo work up done by neuro in [place] and according to pt,including brain imaging, negative.  Last episode of vertigo a couple weeks ago.  Usually happen when laying down, it has a few minutes, alleviated by being still. No associated hearing loss or tinnitus.    Hyperlipidemia:  Currently on Zocor 20 mg daily. Following a low fat diet: ydes.  He has not noted side effects with medication.  Last FLP in 05/2016.     He is planning on moving to [place] with his sister.  He just bought a house in [place], so he will be staying with her sister until 12/2017, when he will be closing on his house.    Review of Systems  Constitutional: Negative for activity change, appetite change, fatigue and fever.  HENT: Negative for congestion, ear pain, hearing loss, nosebleeds, sore throat and trouble swallowing.   Eyes: Negative for redness and visual disturbance.  Respiratory: Negative for shortness of breath and wheezing.   Cardiovascular: Negative for chest pain, palpitations and leg swelling.  Gastrointestinal: Negative for abdominal pain, nausea and vomiting.  Genitourinary: Negative for decreased urine volume and hematuria.  Neurological: Positive for dizziness. Negative for syncope, weakness and headaches.  Psychiatric/Behavioral: Positive for sleep disturbance. Negative for confusion. The patient is nervous/anxious.      Current Outpatient Medications on File Prior to Visit  Medication Sig Dispense Refill  . Cholecalciferol (VITAMIN D) 2000 units CAPS Take 1 capsule by mouth daily.     . clindamycin-benzoyl peroxide (BENZACLIN) gel Apply 1 application topically as needed. 135 g 2  . diazepam (VALIUM) 5 MG tablet Take 1 tablet (5 mg total) by mouth every 12 (twelve) hours as needed for anxiety. No more than 40 tabs per month 40 tablet 2  . fluticasone (FLONASE) 50 MCG/ACT nasal spray Place 1 spray into both nostrils daily.    . Omega-3 Fatty Acids (FISH OIL TRIPLE STRENGTH) 1400 MG CAPS Take 1 capsule by mouth daily.    . sildenafil (VIAGRA) 100 MG tablet Take 1 tablet (100 mg total) by mouth daily as needed for erectile dysfunction. 18 tablet 2  . simvastatin (ZOCOR) 20 MG tablet Take 1 tablet (20 mg total) by mouth at bedtime. 90 tablet 2   No current facility-administered medications on file prior to visit.      Past Medical History:  Diagnosis Date  . Allergy   . Anxiety   . Hyperlipidemia   . Vertigo, benign positional    No Known Allergies  Social History   Socioeconomic History  . Marital status: Married    Spouse name: Not on file  . Number of children: Not on file  . Years of education: Not on file  . Highest education level: Not on file  Occupational History  . Not on file  Social Needs  . Financial resource strain: Not on file  . Food insecurity:    Worry: Not on file    Inability: Not on file  . Transportation needs:  Medical: Not on file    Non-medical: Not on file  Tobacco Use  . Smoking status: Never Smoker  . Smokeless tobacco: Never Used  Substance and Sexual Activity  . Alcohol use: Yes  . Drug use: No  . Sexual activity: Not on file  Lifestyle  . Physical activity:    Days per week: Not on file    Minutes per session: Not on file  . Stress: Not on file  Relationships  . Social connections:    Talks on phone: Not on file    Gets together: Not on file    Attends religious service: Not on file    Active member of club or organization: Not on file    Attends meetings of clubs or organizations: Not on file    Relationship  status: Not on file  Other Topics Concern  . Not on file  Social History Narrative  . Not on file    Vitals:   07/01/17 0759  BP: 132/78  Pulse: 68  Resp: 12  Temp: 98.1 F (36.7 C)  SpO2: 98%   Body mass index is 24.8 kg/m.   Physical Exam  Nursing note and vitals reviewed. Constitutional: He is oriented to person, place, and time. He appears well-developed and well-nourished. No distress.  HENT:  Head: Normocephalic and atraumatic.  Mouth/Throat: Oropharynx is clear and moist and mucous membranes are normal.  Eyes: Pupils are equal, round, and reactive to light. Conjunctivae are normal.  Cardiovascular: Normal rate and regular rhythm.  No murmur heard. Pulses:      Dorsalis pedis pulses are 2+ on the right side, and 2+ on the left side.  Respiratory: Effort normal and breath sounds normal. No respiratory distress.  GI: Soft. He exhibits no mass. There is no hepatomegaly. There is no tenderness.  Musculoskeletal: He exhibits no edema.  Lymphadenopathy:    He has no cervical adenopathy.  Neurological: He is alert and oriented to person, place, and time. He has normal strength. No cranial nerve deficit. Gait normal.  Skin: Skin is warm. No rash noted. No erythema.  Psychiatric: His mood appears anxious.  Well groomed, good eye contact.       ASSESSMENT AND PLAN:   [name] was seen today for 6 months follow-up.  Orders Placed This Encounter  Procedures  . Comprehensive metabolic panel  . Lipid panel  . Hepatitis C antibody  . Ambulatory referral to Gastroenterology   Lab Results  Component Value Date   ALT 14 07/01/2017   AST 17 07/01/2017   ALKPHOS 67 07/01/2017   BILITOT 0.6 07/01/2017   Lab Results  Component Value Date   CHOL 213 (H) 07/01/2017   HDL 51.20 07/01/2017   LDLCALC 142 (H) 07/01/2017   TRIG 101.0 07/01/2017   CHOLHDL 4 07/01/2017   Lab Results  Component Value Date   CREATININE 1.10 07/01/2017   BUN 15 07/01/2017   NA  141 07/01/2017   K 4.5 07/01/2017   CL 104 07/01/2017   CO2 28 07/01/2017   The 10-year ASCVD risk score Denman George(Goff DC Jr., et al., 2013) is: 8.4%   Values used to calculate the score:     Age: [age] years     Sex: Male     Is Non-Hispanic African American: Yes     Diabetic: No     Tobacco smoker: No     Systolic Blood Pressure: 132 mmHg     Is BP treated: No  HDL Cholesterol: 51.2 mg/dL     Total Cholesterol: 213 mg/dL  Hyperlipidemia No changes in current management, will follow labs done today and will give further recommendations accordingly. Follow-up in 6 to 12 months depending of lipid panel results.  Vertigo, benign positional He is still having some episodes, otherwise stable. Fall precautions. No changes in Valium dose.   Anxiety disorder, unspecified Stable. He will continue Valium 5 mg daily as needed. Follow-up in 5 months.   Encounter for HCV screening test for high risk patient - Hepatitis C antibody  Colon cancer screening - Ambulatory referral to Gastroenterology     [place]  [address] [facility]. [place] office.

## 2017-07-02 ENCOUNTER — Encounter: Payer: Self-pay | Admitting: Family Medicine

## 2017-07-02 ENCOUNTER — Telehealth: Payer: Self-pay | Admitting: Gastroenterology

## 2017-07-02 LAB — HEPATITIS C ANTIBODY
Hepatitis C Ab: NONREACTIVE
SIGNAL TO CUT-OFF: 0.03 (ref <1.00)

## 2017-07-02 NOTE — Telephone Encounter (Signed)
Hi Dr. Christella HartiganJacobs, we have received a referral from pt's PCP on 07/01/17 am requesting pt being seen for a repeat colon. Records have been received and placed on your desk for review. Pt would like to know if he is due for a repeat colon. Thank you.

## 2017-07-03 NOTE — Telephone Encounter (Signed)
Patient calling back. Patient notified that Dr.Jacobs recommended his next colon be in 08/2022. Patient states that is sister passed away of colon cancer at age 60 and wants to know if that would change Dr.Jacobs recommendations. Please advise. Records still in referral folder.

## 2017-07-03 NOTE — Telephone Encounter (Signed)
Left message to call back  

## 2017-07-03 NOTE — Telephone Encounter (Signed)
Dr Christella HartiganJacobs please review new information and possible recommendation changes.

## 2017-07-05 NOTE — Telephone Encounter (Signed)
I cannot find his previous colonoscopy report from 2014 in epic? Do you have a copy?

## 2017-07-05 NOTE — Telephone Encounter (Signed)
Do you have any records up front?

## 2017-07-10 ENCOUNTER — Other Ambulatory Visit: Payer: Self-pay | Admitting: Family Medicine

## 2017-07-10 DIAGNOSIS — N529 Male erectile dysfunction, unspecified: Secondary | ICD-10-CM

## 2017-07-26 NOTE — Telephone Encounter (Signed)
Hi Patty, sorry I did not see this message until today. Yes, pt's records are here in the referral folder. Thank you.

## 2017-07-29 NOTE — Telephone Encounter (Signed)
Can you put them on Dr Christella HartiganJacobs desk so he can review when he is back in town?  Thank you

## 2017-07-30 NOTE — Telephone Encounter (Signed)
Sure, will do today.

## 2017-08-13 ENCOUNTER — Telehealth: Payer: Self-pay | Admitting: *Deleted

## 2017-08-13 NOTE — Telephone Encounter (Signed)
Refill request for Diazepam 5 mg

## 2017-08-15 NOTE — Telephone Encounter (Signed)
Records on my desk this morning from patients 2014 colon with same recommendations, for 10y recall in 2024. Did Dr.Jacobs ever get notified that patients sister passed away at age 344 of colon cx? Patient again notified of original recommendations, and although a little worried; patient states he is not having symptoms and is okay with recall. Recall put in system and records sent to scan.

## 2017-08-16 ENCOUNTER — Other Ambulatory Visit: Payer: Self-pay | Admitting: Family Medicine

## 2017-08-16 DIAGNOSIS — H8110 Benign paroxysmal vertigo, unspecified ear: Secondary | ICD-10-CM

## 2017-08-16 DIAGNOSIS — F419 Anxiety disorder, unspecified: Secondary | ICD-10-CM

## 2017-08-16 MED ORDER — DIAZEPAM 5 MG PO TABS: 5.0000 mg | ORAL_TABLET | Freq: Two times a day (BID) | ORAL | 3 refills | Status: DC | PRN

## 2017-08-16 NOTE — Telephone Encounter (Signed)
Patient calling to check on status of Diazepam. Today is 3rd business day.

## 2017-08-16 NOTE — Telephone Encounter (Signed)
See Dr Christella HartiganJacobs note. Pt needs colon as soon as convenient.

## 2017-08-16 NOTE — Telephone Encounter (Signed)
I was able to find his 2014 colonoscopy report in epic today.  Given his significant family history of colon cancer he needs colonoscopy at his soonest convenience now.

## 2017-08-16 NOTE — Telephone Encounter (Signed)
Message sent to Dr. Jordan for review and approval. 

## 2017-08-16 NOTE — Telephone Encounter (Signed)
Prescription for diazepam was sent to his pharmacy. Thanks, BJ

## 2017-08-19 ENCOUNTER — Other Ambulatory Visit: Payer: Self-pay | Admitting: Family Medicine

## 2017-08-19 DIAGNOSIS — F419 Anxiety disorder, unspecified: Secondary | ICD-10-CM

## 2017-08-19 DIAGNOSIS — H8110 Benign paroxysmal vertigo, unspecified ear: Secondary | ICD-10-CM

## 2017-08-19 MED ORDER — DIAZEPAM 5 MG PO TABS: 5.0000 mg | ORAL_TABLET | Freq: Two times a day (BID) | ORAL | 3 refills | Status: DC | PRN

## 2017-08-19 NOTE — Telephone Encounter (Signed)
Prescription was resent to the right pharmacy. Please call pharmacy where prescription was sent initially to cancel it. As patient which pharmacy is going to be using from now on, because this is a controlled medication it should be filled at the same pharmacy.  Thanks, BJ

## 2017-08-19 NOTE — Telephone Encounter (Signed)
Message sent to Dr. Jordan for review and approval. 

## 2017-08-19 NOTE — Telephone Encounter (Signed)
Pt called in stating that it wasn't sent the correct pharmacy. The pharmacy that it was suppose to be sent to is   [id number] [place],  - [address] [facility] [address] [phone] (Phone) [phone] (Fax)

## 2017-08-20 NOTE — Telephone Encounter (Signed)
Rx canceled at other pharmacy.

## 2017-08-26 NOTE — Telephone Encounter (Signed)
Spoke with patient who was now very confused by the situation. Notified pt that Dr.Jacobs re reviewed his records and would like him to schedule at any time. Patient states he will cb to schedule. Referral will remain closed till then.

## 2017-09-22 ENCOUNTER — Other Ambulatory Visit: Payer: Self-pay | Admitting: Family Medicine

## 2017-09-22 DIAGNOSIS — E785 Hyperlipidemia, unspecified: Secondary | ICD-10-CM

## 2017-10-02 ENCOUNTER — Encounter: Payer: Self-pay | Admitting: Gastroenterology

## 2017-11-04 ENCOUNTER — Ambulatory Visit (AMBULATORY_SURGERY_CENTER): Payer: Self-pay | Admitting: *Deleted

## 2017-11-04 VITALS — Ht 72.0 in | Wt 181.0 lb

## 2017-11-04 DIAGNOSIS — Z800 Family history of malignant neoplasm of digestive organs: Secondary | ICD-10-CM

## 2017-11-04 MED ORDER — PEG 3350-KCL-NA BICARB-NACL 420 G PO SOLR: 4000.0000 mL | Freq: Once | ORAL | 0 refills | Status: AC

## 2017-11-04 NOTE — Progress Notes (Signed)
No egg or soy allergy known to patient  No issues with past sedation with any surgeries  or procedures, no past intubation  No diet pills per patient No home 02 use per patient  No blood thinners per patient  Pt denies issues with constipation  No A fib or A flutter  EMMI video sent to pt's e mail -- pt declined   

## 2017-11-06 ENCOUNTER — Encounter: Payer: Self-pay | Admitting: Gastroenterology

## 2017-11-12 ENCOUNTER — Other Ambulatory Visit: Payer: Self-pay | Admitting: Family Medicine

## 2017-11-12 DIAGNOSIS — L700 Acne vulgaris: Secondary | ICD-10-CM

## 2017-11-20 ENCOUNTER — Ambulatory Visit (AMBULATORY_SURGERY_CENTER): Payer: No Typology Code available for payment source | Admitting: Gastroenterology

## 2017-11-20 ENCOUNTER — Encounter: Payer: Self-pay | Admitting: Gastroenterology

## 2017-11-20 VITALS — BP 123/82 | HR 72 | Temp 97.5°F | Resp 12 | Ht 72.0 in | Wt 181.0 lb

## 2017-11-20 DIAGNOSIS — Z800 Family history of malignant neoplasm of digestive organs: Secondary | ICD-10-CM | POA: Diagnosis present

## 2017-11-20 DIAGNOSIS — Z1211 Encounter for screening for malignant neoplasm of colon: Secondary | ICD-10-CM | POA: Diagnosis not present

## 2017-11-20 MED ORDER — SODIUM CHLORIDE 0.9 % IV SOLN: 500.0000 mL | Freq: Once | INTRAVENOUS | Status: DC

## 2017-11-20 NOTE — Progress Notes (Signed)
To PACU, VSS. Report to [email or web address] 

## 2017-11-20 NOTE — Patient Instructions (Signed)
YOU HAD AN ENDOSCOPIC PROCEDURE TODAY AT THE [facility]:   Refer to the procedure report that was given to you for any specific questions about what was found during the examination.  If the procedure report does not answer your questions, please call your gastroenterologist to clarify.  If you requested that your care partner not be given the details of your procedure findings, then the procedure report has been included in a sealed envelope for you to review at your convenience later.  YOU SHOULD EXPECT: Some feelings of bloating in the abdomen. Passage of more gas than usual.  Walking can help get rid of the air that was put into your GI tract during the procedure and reduce the bloating. If you had a lower endoscopy (such as a colonoscopy or flexible sigmoidoscopy) you may notice spotting of blood in your stool or on the toilet paper. If you underwent a bowel prep for your procedure, you may not have a normal bowel movement for a few days.  Please Note:  You might notice some irritation and congestion in your nose or some drainage.  This is from the oxygen used during your procedure.  There is no need for concern and it should clear up in a day or so.  SYMPTOMS TO REPORT IMMEDIATELY:   Following lower endoscopy (colonoscopy or flexible sigmoidoscopy):  Excessive amounts of blood in the stool  Significant tenderness or worsening of abdominal pains  Swelling of the abdomen that is new, acute  Fever of 100F or higher  For urgent or emergent issues, a gastroenterologist can be reached at any hour by calling [phone].   DIET:  We do recommend a small meal at first, but then you may proceed to your regular diet.  Drink plenty of fluids but you should avoid alcoholic beverages for 24 hours.  ACTIVITY:  You should plan to take it easy for the rest of today and you should NOT DRIVE or use heavy machinery until tomorrow (because of the sedation medicines used during the test).     FOLLOW UP: Our staff will call the number listed on your records the next business day following your procedure to check on you and address any questions or concerns that you may have regarding the information given to you following your procedure. If we do not reach you, we will leave a message.  However, if you are feeling well and you are not experiencing any problems, there is no need to return our call.  We will assume that you have returned to your regular daily activities without incident.  If any biopsies were taken you will be contacted by phone or by letter within the next 1-3 weeks.  Please call us at [phone] if you have not heard about the biopsies in 3 weeks.    SIGNATURES/CONFIDENTIALITY: You and/or your care partner have signed paperwork which will be entered into your electronic medical record.  These signatures attest to the fact that that the information above on your After Visit Summary has been reviewed and is understood.  Full responsibility of the confidentiality of this discharge information lies with you and/or your care-partner. 

## 2017-11-20 NOTE — Op Note (Signed)
[facility] Patient Name: Franklin RoughRicky Lia Procedure Date: 11/20/2017 10:53 AM MRN: [id number] Endoscopist: Rachael Feeaniel P  , MD Age: [age] Referring MD:  Date of Birth: 14-Apr-1955 Gender: Male Account #: [id number] Procedure:                Colonoscopy Indications:              Screening in patient at increased risk: Family                            history of 1st-degree relative with colorectal                            cancer before age 60 years (sister diagnosed with                            colon cancer in her 8140s) Medicines:                Monitored Anesthesia Care Procedure:                Pre-Anesthesia Assessment:                           - Prior to the procedure, a History and Physical                            was performed, and patient medications and                            allergies were reviewed. The patient's tolerance of                            previous anesthesia was also reviewed. The risks                            and benefits of the procedure and the sedation                            options and risks were discussed with the patient.                            All questions were answered, and informed consent                            was obtained. Prior Anticoagulants: The patient has                            taken no previous anticoagulant or antiplatelet                            agents. ASA Grade Assessment: II - A patient with                            mild systemic disease. After reviewing the risks  and benefits, the patient was deemed in                            satisfactory condition to undergo the procedure.                           After obtaining informed consent, the colonoscope                            was passed under direct vision. Throughout the                            procedure, the patient's blood pressure, pulse, and                            oxygen saturations were monitored  continuously. The                            Colonoscope was introduced through the anus and                            advanced to the the cecum, identified by                            appendiceal orifice and ileocecal valve. The                            colonoscopy was performed without difficulty. The                            patient tolerated the procedure well. The quality                            of the bowel preparation was good. The ileocecal                            valve, appendiceal orifice, and rectum were                            photographed. Scope In: 11:02:48 AM Scope Out: 11:12:48 AM Scope Withdrawal Time: 0 hours 7 minutes 7 seconds  Total Procedure Duration: 0 hours 10 minutes 0 seconds  Findings:                 The entire examined colon appeared normal on direct                            and retroflexion views. Complications:            No immediate complications. Estimated blood loss:                            None. Estimated Blood Loss:     Estimated blood loss: none. Impression:               - The entire examined colon is normal  on direct and                            retroflexion views.                           - No polyps or cancers. Recommendation:           - Patient has a contact number available for                            emergencies. The signs and symptoms of potential                            delayed complications were discussed with the                            patient. Return to normal activities tomorrow.                            Written discharge instructions were provided to the                            patient.                           - Resume previous diet.                           - Continue present medications.                           - Repeat colonoscopy in 5 years for screening                            purposes. Rachael Fee, MD 11/20/2017 11:18:29 AM This report has been signed electronically.

## 2017-11-21 ENCOUNTER — Telehealth: Payer: Self-pay

## 2017-11-21 ENCOUNTER — Telehealth: Payer: Self-pay | Admitting: *Deleted

## 2017-11-21 NOTE — Telephone Encounter (Signed)
First attempt, no answer- left VM.  

## 2017-11-21 NOTE — Telephone Encounter (Signed)
  Follow up Call-  Call back number 11/20/2017  Post procedure Call Back phone  # [phone]  Permission to leave phone message Yes     Patient questions:  Do you have a fever, pain , or abdominal swelling? No. Pain Score  0 *  Have you tolerated food without any problems? Yes.    Have you been able to return to your normal activities? Yes.    Do you have any questions about your discharge instructions: Diet   No. Medications  No. Follow up visit  No.  Do you have questions or concerns about your Care? No.  Actions: * If pain score is 4 or above: No action needed, pain <4.

## 2017-12-23 ENCOUNTER — Ambulatory Visit: Payer: No Typology Code available for payment source | Admitting: Family Medicine

## 2017-12-23 ENCOUNTER — Encounter: Payer: Self-pay | Admitting: Family Medicine

## 2017-12-23 VITALS — BP 122/74 | HR 68 | Temp 98.0°F | Resp 12 | Ht 72.0 in | Wt 182.2 lb

## 2017-12-23 DIAGNOSIS — F419 Anxiety disorder, unspecified: Secondary | ICD-10-CM

## 2017-12-23 DIAGNOSIS — E785 Hyperlipidemia, unspecified: Secondary | ICD-10-CM | POA: Diagnosis not present

## 2017-12-23 DIAGNOSIS — H8110 Benign paroxysmal vertigo, unspecified ear: Secondary | ICD-10-CM | POA: Diagnosis not present

## 2017-12-23 DIAGNOSIS — Z23 Encounter for immunization: Secondary | ICD-10-CM

## 2017-12-23 MED ORDER — DIAZEPAM 5 MG PO TABS: 5.0000 mg | ORAL_TABLET | Freq: Two times a day (BID) | ORAL | 3 refills | Status: DC | PRN

## 2017-12-23 MED ORDER — SIMVASTATIN 20 MG PO TABS: 20.0000 mg | ORAL_TABLET | Freq: Every day | ORAL | 2 refills | Status: DC

## 2017-12-23 NOTE — Assessment & Plan Note (Signed)
Problem is stable. No changes in Valium 5 mg daily as needed. He is not interested in daily SSRI medication. Follow-up in 6 months.

## 2017-12-23 NOTE — Assessment & Plan Note (Signed)
Still having intermittent symptoms but not as severe as he had in the past. Continue Valium 5 mg bid prn for more severe episodes. Fall precautions. Instructed about warning signs.

## 2017-12-23 NOTE — Patient Instructions (Signed)
A few things to remember from today's visit:   Benign paroxysmal positional vertigo, unspecified laterality  Hyperlipidemia, unspecified hyperlipidemia type - Plan: simvastatin (ZOCOR) 20 MG tablet  Anxiety disorder, unspecified type  No changes today.    Please be sure medication list is accurate. If a new problem present, please set up appointment sooner than planned today.

## 2017-12-23 NOTE — Assessment & Plan Note (Signed)
Tolerating Simvastatin well. He prefers to check FLP next visit (CPE). Encouraged to continue low fat diet.

## 2017-12-23 NOTE — Progress Notes (Signed)
HPI:   [name] is a 62 y.o. male, who is here today for 6 months follow up.   He was last seen on 07/01/17. No new problem since his last OV.,  Vertigo: Last episode 3 months ago, spinning like sensation.It lasted a few minutes. Exacerbated by lying down/head movement.  Denies new associated symptoms. He takes Valium 5 mg is needed.  Negative for headache,chest pain, dyspnea,nausea, or vomiting.  Anxiety: Valium 5 mg bid prn still helping. He takes medication once daily most of the time. He denies depressed mood or suicidal thoughts. He is now living alone,recently moved to his new place.    Hyperlipidemia: Currently on Simvastatin 20 mg daily. Following a low fat diet: Yes.  He has not noted side effects with medication.  Lab Results  Component Value Date   CHOL 213 (H) 07/01/2017   HDL 51.20 07/01/2017   LDLCALC 142 (H) 07/01/2017   TRIG 101.0 07/01/2017   CHOLHDL 4 07/01/2017    Review of Systems  Constitutional: Negative for activity change, appetite change, fatigue and fever.  HENT: Negative for hearing loss, mouth sores and sore throat.   Eyes: Negative for redness and visual disturbance.  Respiratory: Negative for shortness of breath and wheezing.   Cardiovascular: Negative for chest pain, palpitations and leg swelling.  Gastrointestinal: Negative for abdominal pain, nausea and vomiting.  Neurological: Positive for dizziness. Negative for syncope, weakness and headaches.  Psychiatric/Behavioral: Negative for confusion, hallucinations and suicidal ideas. The patient is nervous/anxious.      Current Outpatient Medications on File Prior to Visit  Medication Sig Dispense Refill  . Cholecalciferol (VITAMIN D) 2000 units CAPS Take 1 capsule by mouth daily.    . clindamycin-benzoyl peroxide (BENZACLIN) gel Apply 1 application topically as needed. 135 g 2  . fluticasone (FLONASE) 50 MCG/ACT nasal spray Place 1 spray into both nostrils daily.      . Omega-3 Fatty Acids (FISH OIL TRIPLE STRENGTH) 1400 MG CAPS Take 1 capsule by mouth daily.    . sildenafil (VIAGRA) 100 MG tablet TAKE 1 TABLET BY MOUTH DAILY AS NEEDED FOR ERECTILE DYSFUNCTION 18 tablet 3   No current facility-administered medications on file prior to visit.      Past Medical History:  Diagnosis Date  . Allergy   . Anxiety   . Cataract    early cataracts   . Hyperlipidemia   . Vertigo, benign positional    No Known Allergies  Social History   Socioeconomic History  . Marital status: Married    Spouse name: Not on file  . Number of children: Not on file  . Years of education: Not on file  . Highest education level: Not on file  Occupational History  . Not on file  Social Needs  . Financial resource strain: Not on file  . Food insecurity:    Worry: Not on file    Inability: Not on file  . Transportation needs:    Medical: Not on file    Non-medical: Not on file  Tobacco Use  . Smoking status: Never Smoker  . Smokeless tobacco: Never Used  Substance and Sexual Activity  . Alcohol use: Yes    Comment: occasionally   . Drug use: No  . Sexual activity: Not on file  Lifestyle  . Physical activity:    Days per week: Not on file    Minutes per session: Not on file  . Stress: Not on file  Relationships  .  Social connections:    Talks on phone: Not on file    Gets together: Not on file    Attends religious service: Not on file    Active member of club or organization: Not on file    Attends meetings of clubs or organizations: Not on file    Relationship status: Not on file  Other Topics Concern  . Not on file  Social History Narrative  . Not on file    Vitals:   12/23/17 0953  BP: 122/74  Pulse: 68  Resp: 12  Temp: 98 F (36.7 C)  SpO2: 98%   Body mass index is 24.72 kg/m.   Physical Exam  Nursing note and vitals reviewed. Constitutional: He is oriented to person, place, and time. He appears well-developed and well-nourished. No  distress.  HENT:  Head: Normocephalic and atraumatic.  Mouth/Throat: Oropharynx is clear and moist and mucous membranes are normal.  Eyes: Pupils are equal, round, and reactive to light. Conjunctivae are normal.  Cardiovascular: Normal rate and regular rhythm.  No murmur heard. Pulses:      Posterior tibial pulses are 2+ on the right side and 2+ on the left side.  Respiratory: Effort normal and breath sounds normal. No respiratory distress.  GI: Soft. He exhibits no mass. There is no hepatomegaly. There is no abdominal tenderness.  Musculoskeletal:        General: No edema.  Lymphadenopathy:    He has no cervical adenopathy.  Neurological: He is alert and oriented to person, place, and time. He has normal strength. No cranial nerve deficit. Gait normal.  Skin: Skin is warm. No rash noted. No erythema.  Psychiatric: His mood appears anxious. Cognition and memory are normal.  Well groomed, good eye contact.       ASSESSMENT AND PLAN:   [name] was seen today for 6 months follow-up.  No orders of the defined types were placed in this encounter.   Hyperlipidemia Tolerating Simvastatin well. He prefers to check FLP next visit (CPE). Encouraged to continue low fat diet.    Vertigo, benign positional Still having intermittent symptoms but not as severe as he had in the past. Continue Valium 5 mg bid prn for more severe episodes. Fall precautions. Instructed about warning signs.  Anxiety disorder, unspecified Problem is stable. No changes in Valium 5 mg daily as needed. He is not interested in daily SSRI medication. Follow-up in 6 months.   Need for immunization against influenza - Flu Vaccine QUAD 36+ mos IM      Yanelis Osika G. [place]az[place]Jordan, MD  [facility]. [place] office.

## 2018-01-08 HISTORY — PX: CATARACT EXTRACTION, BILATERAL: SHX1313

## 2018-03-10 ENCOUNTER — Telehealth: Payer: Self-pay | Admitting: Family Medicine

## 2018-03-10 NOTE — Telephone Encounter (Signed)
Copied from CRM [id number]. Topic: General - Other >> Mar 10, 2018 10:04 AM Leafy Ro wrote: Reason for CRM:pt has called the pharm. Pt needs clindamycin-benzoyl peroxide 2 bottles this will be  40 day supply .  [facility] [employer]. Pt was last seen on 12-23-2017

## 2018-03-11 NOTE — Telephone Encounter (Signed)
Message sent to Dr. Jordan for review and approval. 

## 2018-03-14 ENCOUNTER — Other Ambulatory Visit: Payer: Self-pay | Admitting: Family Medicine

## 2018-03-14 DIAGNOSIS — L700 Acne vulgaris: Secondary | ICD-10-CM

## 2018-03-14 MED ORDER — CLINDAMYCIN PHOS-BENZOYL PEROX 1-5 % EX GEL: 1.0000 "application " | CUTANEOUS | 2 refills | Status: DC | PRN

## 2018-03-14 NOTE — Telephone Encounter (Signed)
Prescription for clindamycin-benzoyl peroxide sent to his pharmacy as requested. Thanks BJ

## 2018-03-14 NOTE — Addendum Note (Signed)
Addended by: [place], Marylyn Appenzeller G on: 03/14/2018 05:24 PM   Modules accepted: Orders

## 2018-03-17 ENCOUNTER — Encounter: Payer: Self-pay | Admitting: Family Medicine

## 2018-04-29 ENCOUNTER — Other Ambulatory Visit: Payer: Self-pay | Admitting: Family Medicine

## 2018-04-29 DIAGNOSIS — H8110 Benign paroxysmal vertigo, unspecified ear: Secondary | ICD-10-CM

## 2018-04-29 DIAGNOSIS — F419 Anxiety disorder, unspecified: Secondary | ICD-10-CM

## 2018-06-06 ENCOUNTER — Other Ambulatory Visit: Payer: Self-pay

## 2018-06-06 ENCOUNTER — Ambulatory Visit (INDEPENDENT_AMBULATORY_CARE_PROVIDER_SITE_OTHER): Payer: No Typology Code available for payment source | Admitting: Family Medicine

## 2018-06-06 ENCOUNTER — Encounter: Payer: Self-pay | Admitting: Family Medicine

## 2018-06-06 VITALS — BP 130/80 | HR 93 | Temp 98.6°F | Resp 12 | Ht 72.0 in | Wt 182.1 lb

## 2018-06-06 DIAGNOSIS — Z23 Encounter for immunization: Secondary | ICD-10-CM | POA: Diagnosis not present

## 2018-06-06 DIAGNOSIS — R970 Elevated carcinoembryonic antigen [CEA]: Secondary | ICD-10-CM | POA: Diagnosis not present

## 2018-06-06 DIAGNOSIS — E785 Hyperlipidemia, unspecified: Secondary | ICD-10-CM | POA: Diagnosis not present

## 2018-06-06 DIAGNOSIS — Z0000 Encounter for general adult medical examination without abnormal findings: Secondary | ICD-10-CM | POA: Diagnosis not present

## 2018-06-06 DIAGNOSIS — F419 Anxiety disorder, unspecified: Secondary | ICD-10-CM

## 2018-06-06 NOTE — Assessment & Plan Note (Signed)
Well-controlled. No changes in simvastatin 20 mg daily. Continue low-fat diet. Follow-up in a year.

## 2018-06-06 NOTE — Patient Instructions (Addendum)
A few things to remember from today's visit:   No diagnosis found.   At least 150 minutes of moderate exercise per week, daily brisk walking for 15-30 min is a good exercise option. Healthy diet low in saturated (animal) fats and sweets and consisting of fresh fruits and vegetables, lean meats such as fish and white chicken and whole grains.  - Vaccines:  Tdap vaccine every 10 years.  Shingles vaccine recommended at age 60, could be given after 61 years of age but not sure about insurance coverage.  Pneumonia vaccines:  Prevnar 13 at 65 and Pneumovax at 66.   -Screening recommendations for low/normal risk males:  Screening for diabetes at age 45 and every 3 years. Earlier screening if cardiovascular risk factors.  Colon cancer screening at age 50 and until age 75.  Prostate cancer screening: some controversy, starts usually at 50: Rectal exam and PSA.  Aortic Abdominal Aneurism once between 65 and 75 years old if ever smoker.  Also recommended:  1. Dental visit- Brush and floss your teeth twice daily; visit your dentist twice a year. 2. Eye doctor- Get an eye exam at least every 2 years. 3. Helmet use- Always wear a helmet when riding a bicycle, motorcycle, rollerblading or skateboarding. 4. Safe sex- If you may be exposed to sexually transmitted infections, use a condom. 5. Seat belts- Seat belts can save your live; always wear one. 6. Smoke/Carbon Monoxide detectors- These detectors need to be installed on the appropriate level of your home. Replace batteries at least once a year. 7. Skin cancer- When out in the sun please cover up and use sunscreen 15 SPF or higher. 8. Violence- If anyone is threatening or hurting you, please tell your healthcare provider.  9. Drink alcohol in moderation- Limit alcohol intake to one drink or less per day. Never drink and drive.  Please be sure medication list is accurate. If a new problem present, please set up appointment sooner than  planned today.         

## 2018-06-06 NOTE — Progress Notes (Signed)
HPI:  Franklin Baker is a 63 y.o.male here today for his routine physical examination.  Last CPE: 12/28/16. He lives with his daughter.  In a couple months his wife will move with him.  Regular exercise 3 or more times per week: He walks a few times per week. Following a healthy diet: Yes   Chronic medical problems: Vertigo, ED, hyperlipidemia, and anxiety among some.  Hx of STD's: Denies.  Immunization History  Administered Date(s) Administered  . Influenza,inj,Quad PF,6+ Mos 12/28/2016, 12/23/2017  . Zoster Recombinat (Shingrix) 06/06/2018   -Hep C screening: 05/2016 NR.  Last colon cancer screening: 11/2017. Last prostate ca screening: 03/2018 was 3.32 He denies changes in urinary frequency, gross hematuria, or nocturia.  -Denies high alcohol intake, tobacco use, or Hx of illicit drug use.  -Concerns and/or follow up today:   Anxiety and vertigo,he takes Valium 5 mg bid as needed. Negative for depression or suicidal thoughts.  Hyperlipidemia, currently he is on Simvastatin 20 mg daily. Following a low fat diet. 03/24/18: TC 195,LDL 125,HDL 53,and TG 82.  He also mentions 3-4 days of mild pain ,right cervical and upper back.This is a new problem,no Hx of trauma. Exacerbated by rotating head towards right. No skin changes,numbness,or tingling. No limitation of ROM.   He recently had labs in order to get a new life insurance. FLP,CMP,PSA,UA,BNPCBC,HgA1C (5.9): Normal.  CEA also ordered and slightly above normal range, 3.0 (0-2.5). He denies abdominal pain,nausea,vomiting,changes in bowel habits,or abnormal wt loss.   Review of Systems  Constitutional: Negative for activity change, appetite change, fatigue and fever.  HENT: Negative for dental problem, nosebleeds, sore throat and trouble swallowing.   Eyes: Negative for redness and visual disturbance.  Respiratory: Negative for cough, shortness of breath and wheezing.   Cardiovascular: Negative for chest pain,  palpitations and leg swelling.  Gastrointestinal: Negative for abdominal pain, blood in stool, nausea and vomiting.  Endocrine: Negative for cold intolerance, heat intolerance, polydipsia, polyphagia and polyuria.  Genitourinary: Negative for decreased urine volume, discharge, dysuria, genital sores, hematuria and testicular pain.  Musculoskeletal: Negative for gait problem and myalgias. Right-sided upper back/cervical pain. Skin: Negative for color change and rash.  Allergic/Immunologic: Positive for environmental allergies.  Neurological: Negative for syncope, weakness and headaches.  Hematological: Negative for adenopathy. Does not bruise/bleed easily.  Psychiatric/Behavioral: Negative for confusion. The patient is not nervous/anxious.   All other systems reviewed and are negative.  Current Outpatient Medications on File Prior to Visit  Medication Sig Dispense Refill  . Cholecalciferol (VITAMIN D) 2000 units CAPS Take 1 capsule by mouth daily.    . clindamycin-benzoyl peroxide (BENZACLIN) gel Apply 1 application topically as needed. 100 g 2  . diazepam (VALIUM) 5 MG tablet TAKE 1 TABLET BY MOUTH EVERY 12 HOURS AS NEEDED FOR ANXIETY. NO MORE THAN 40 TABLETS A MONTH. 40 tablet 2  . fluticasone (FLONASE) 50 MCG/ACT nasal spray Place 1 spray into both nostrils daily.    . Omega-3 Fatty Acids (FISH OIL TRIPLE STRENGTH) 1400 MG CAPS Take 1 capsule by mouth daily.    . sildenafil (VIAGRA) 100 MG tablet TAKE 1 TABLET BY MOUTH DAILY AS NEEDED FOR ERECTILE DYSFUNCTION 18 tablet 3  . simvastatin (ZOCOR) 20 MG tablet Take 1 tablet (20 mg total) by mouth at bedtime. 90 tablet 2   No current facility-administered medications on file prior to visit.    Past Medical History:  Diagnosis Date  . Allergy   . Anxiety   . Cataract  early cataracts   . Hyperlipidemia   . Vertigo, benign positional    Past Surgical History:  Procedure Laterality Date  . COLONOSCOPY     last colon 2014- normal     No Known Allergies  Family History  Problem Relation Age of Onset  . Cancer Mother   . Breast cancer Mother   . Colon cancer Sister 68       died age 57  . Colon polyps Neg Hx   . Esophageal cancer Neg Hx   . Rectal cancer Neg Hx   . Stomach cancer Neg Hx    Social History   Socioeconomic History  . Marital status: Married    Spouse name: Not on file  . Number of children: Not on file  . Years of education: Not on file  . Highest education level: Not on file  Occupational History  . Not on file  Social Needs  . Financial resource strain: Not on file  . Food insecurity:    Worry: Not on file    Inability: Not on file  . Transportation needs:    Medical: Not on file    Non-medical: Not on file  Tobacco Use  . Smoking status: Never Smoker  . Smokeless tobacco: Never Used  Substance and Sexual Activity  . Alcohol use: Yes    Comment: occasionally   . Drug use: No  . Sexual activity: Not on file  Lifestyle  . Physical activity:    Days per week: Not on file    Minutes per session: Not on file  . Stress: Not on file  Relationships  . Social connections:    Talks on phone: Not on file    Gets together: Not on file    Attends religious service: Not on file    Active member of club or organization: Not on file    Attends meetings of clubs or organizations: Not on file    Relationship status: Not on file  Other Topics Concern  . Not on file  Social History Narrative  . Not on file    Today's Vitals   06/06/18 1048  BP: 130/80  Pulse: 93  Resp: 12  Temp: 98.6 F (37 C)  TempSrc: Oral  SpO2: 99%  Weight: 182 lb 2 oz (82.6 kg)  Height: 6' (1.829 m)   Body mass index is 24.7 kg/m.  Wt Readings from Last 3 Encounters:  06/06/18 182 lb 2 oz (82.6 kg)  12/23/17 182 lb 4 oz (82.7 kg)  11/20/17 181 lb (82.1 kg)     Physical Exam  Nursing note and vitals reviewed. Constitutional: He is oriented to person, place, and time. He appears well-developed and  well-nourished. No distress.  HENT:  Head: Normocephalic and atraumatic.  Right Ear: Tympanic membrane, external ear and ear canal normal.  Left Ear: Tympanic membrane, external ear and ear canal normal.  Mouth/Throat: Oropharynx is clear and moist and mucous membranes are normal.  Eyes: Pupils are equal, round, and reactive to light. Conjunctivae and EOM are normal.  Neck: Normal range of motion. No tracheal deviation present. No thyromegaly present.  Cardiovascular: Normal rate and regular rhythm.  No murmur heard. Pulses:      Dorsalis pedis pulses are 2+ on the right side and 2+ on the left side.  Respiratory: Effort normal and breath sounds normal. No respiratory distress.  GI: Soft. He exhibits no mass. There is no hepatomegaly. There is no abdominal tenderness.  Genitourinary:  Genitourinary Comments: Refused,no concerns.   Musculoskeletal:        General: No tenderness or edema. Cervical:No tenderness upon palpation of paraspinal muscles or trapezium and otherwise normal ROM.    Comments: No major deformities appreciated and no signs of synovitis.  Lymphadenopathy:    He has no cervical adenopathy.       Right: No supraclavicular adenopathy present.       Left: No supraclavicular adenopathy present.  Neurological: He is alert and oriented to person, place, and time. He has normal strength. No cranial nerve deficit or sensory deficit. Coordination and gait normal.  Reflex Scores:      Bicep reflexes are 2+ on the right side and 2+ on the left side.      Patellar reflexes are 2+ on the right side and 2+ on the left side. Skin: Skin is warm. No erythema.  Psychiatric: He has a normal mood and affect. Cognition and memory are normal.  Well groomed,good eye contact.   Franklin Baker was here today annual physical examination.  Orders Placed This Encounter  Procedures  . Varicella-zoster vaccine IM (Shingrix)  . CEA    Routine general medical examination at a health care  facility We discussed the importance of regular physical activity and healthy diet for prevention of chronic illness and/or complications. Preventive guidelines reviewed. Vaccination : Shingrix given today.  Next CPE in a year.  Elevated CEA -     CEA  Need for shingles vaccine -     Varicella-zoster vaccine IM (Shingrix)  Hyperlipidemia Well-controlled. No changes in simvastatin 20 mg daily. Continue low-fat diet. Follow-up in a year.  Anxiety disorder, unspecified Problem is stable. Continue Valium 5 mg twice daily as needed. Follow-up in 6 months.    Return in 6 months (on 12/07/2018).     Allia Wiltsey G. [place], MD  [facility]. [place] office.

## 2018-06-06 NOTE — Assessment & Plan Note (Signed)
Problem is stable. Continue Valium 5 mg twice daily as needed. Follow-up in 6 months.

## 2018-06-07 ENCOUNTER — Encounter: Payer: Self-pay | Admitting: Family Medicine

## 2018-06-09 LAB — CEA: CEA: 2.6 ng/mL — ABNORMAL HIGH

## 2018-06-11 ENCOUNTER — Encounter: Payer: Self-pay | Admitting: Family Medicine

## 2018-07-07 ENCOUNTER — Other Ambulatory Visit: Payer: Self-pay | Admitting: Family Medicine

## 2018-07-07 DIAGNOSIS — L700 Acne vulgaris: Secondary | ICD-10-CM

## 2018-08-06 ENCOUNTER — Other Ambulatory Visit: Payer: Self-pay | Admitting: Family Medicine

## 2018-08-06 DIAGNOSIS — F419 Anxiety disorder, unspecified: Secondary | ICD-10-CM

## 2018-08-06 DIAGNOSIS — N529 Male erectile dysfunction, unspecified: Secondary | ICD-10-CM

## 2018-08-06 DIAGNOSIS — H8110 Benign paroxysmal vertigo, unspecified ear: Secondary | ICD-10-CM

## 2018-08-07 NOTE — Telephone Encounter (Signed)
   LAST APPOINTMENT DATE: 06/06/2018  NEXT APPOINTMENT DATE:@7 /31/2020  OTHER COMMENTS: Sildenafil 100 mg last refill 05/05/2018 18 tab Refill 3 Diazepam 5 Mg Refills 2   Okay for refill?   **Let patient know to contact pharmacy at the end of the day to make sure medication is ready. **  ** Please notify patient to allow 48-72 hours to process**  **Encourage patient to contact the pharmacy for refills or they can request refills through [facility]**

## 2018-08-08 ENCOUNTER — Ambulatory Visit (INDEPENDENT_AMBULATORY_CARE_PROVIDER_SITE_OTHER): Payer: No Typology Code available for payment source

## 2018-08-08 DIAGNOSIS — Z23 Encounter for immunization: Secondary | ICD-10-CM

## 2018-08-08 NOTE — Progress Notes (Signed)
After obtaining consent, and per orders of Dr. [place], injection of Shingrix 0.48mL [place] given in right arm by Dwyne Hasegawa Berneta Sages. Patient instructed to remain in clinic for 20 minutes afterwards, and to report any adverse reaction to me immediately. thx dmf

## 2018-09-28 ENCOUNTER — Other Ambulatory Visit: Payer: Self-pay | Admitting: Family Medicine

## 2018-09-28 DIAGNOSIS — E785 Hyperlipidemia, unspecified: Secondary | ICD-10-CM

## 2018-11-17 ENCOUNTER — Emergency Department: Payer: BLUE CROSS/BLUE SHIELD

## 2018-11-17 ENCOUNTER — Other Ambulatory Visit: Payer: Self-pay

## 2018-11-17 ENCOUNTER — Encounter: Payer: Self-pay | Admitting: Intensive Care

## 2018-11-17 ENCOUNTER — Emergency Department
Admission: EM | Admit: 2018-11-17 | Discharge: 2018-11-17 | Disposition: A | Discharge status: Home / Self Care | Payer: BLUE CROSS/BLUE SHIELD | Attending: Emergency Medicine | Admitting: Emergency Medicine

## 2018-11-17 DIAGNOSIS — Z79899 Other long term (current) drug therapy: Secondary | ICD-10-CM | POA: Diagnosis not present

## 2018-11-17 DIAGNOSIS — N390 Urinary tract infection, site not specified: Secondary | ICD-10-CM | POA: Diagnosis not present

## 2018-11-17 DIAGNOSIS — R55 Syncope and collapse: Secondary | ICD-10-CM

## 2018-11-17 LAB — COMPREHENSIVE METABOLIC PANEL
ALT: 67 U/L — ABNORMAL HIGH (ref 0–44)
AST: 42 U/L — ABNORMAL HIGH (ref 15–41)
Albumin: 4 g/dL (ref 3.5–5.0)
Alkaline Phosphatase: 81 U/L (ref 38–126)
Anion gap: 9 (ref 5–15)
BUN: 15 mg/dL (ref 8–23)
CO2: 25 mmol/L (ref 22–32)
Calcium: 9.2 mg/dL (ref 8.9–10.3)
Chloride: 105 mmol/L (ref 98–111)
Creatinine, Ser: 0.91 mg/dL (ref 0.61–1.24)
GFR calc Af Amer: >60 mL/min (ref >60)
GFR calc non Af Amer: >60 mL/min (ref >60)
Glucose, Bld: 104 mg/dL — ABNORMAL HIGH (ref 70–99)
Potassium: 3.6 mmol/L (ref 3.5–5.1)
Sodium: 139 mmol/L (ref 135–145)
Total Bilirubin: 1 mg/dL (ref 0.3–1.2)
Total Protein: 7.8 g/dL (ref 6.5–8.1)

## 2018-11-17 LAB — URINALYSIS, COMPLETE (UACMP) WITH MICROSCOPIC
Bacteria, UA: NONE SEEN
Bilirubin Urine: NEGATIVE
Glucose, UA: NEGATIVE mg/dL
Ketones, ur: NEGATIVE mg/dL
Nitrite: NEGATIVE
Protein, ur: 100 mg/dL — AB
Specific Gravity, Urine: 1.025 (ref 1.005–1.030)
Squamous Epithelial / HPF: NONE SEEN (ref 0–5)
WBC, UA: >50 WBC/hpf — ABNORMAL HIGH (ref 0–5)
pH: 5 (ref 5.0–8.0)

## 2018-11-17 LAB — CBC WITH DIFFERENTIAL/PLATELET
Abs Immature Granulocytes: 0.09 10*3/uL — ABNORMAL HIGH (ref 0.00–0.07)
Basophils Absolute: 0 10*3/uL (ref 0.0–0.1)
Basophils Relative: 0 %
Eosinophils Absolute: 0 10*3/uL (ref 0.0–0.5)
Eosinophils Relative: 0 %
HCT: 40.9 % (ref 39.0–52.0)
Hemoglobin: 14 g/dL (ref 13.0–17.0)
Immature Granulocytes: 1 %
Lymphocytes Relative: 11 %
Lymphs Abs: 1.7 10*3/uL (ref 0.7–4.0)
MCH: 28.4 pg (ref 26.0–34.0)
MCHC: 34.2 g/dL (ref 30.0–36.0)
MCV: 83 fL (ref 80.0–100.0)
Monocytes Absolute: 0.8 10*3/uL (ref 0.1–1.0)
Monocytes Relative: 5 %
Neutro Abs: 12.8 10*3/uL — ABNORMAL HIGH (ref 1.7–7.7)
Neutrophils Relative %: 83 %
Platelets: 253 10*3/uL (ref 150–400)
RBC: 4.93 MIL/uL (ref 4.22–5.81)
RDW: 13.5 % (ref 11.5–15.5)
WBC: 15.3 10*3/uL — ABNORMAL HIGH (ref 4.0–10.5)
nRBC: 0 % (ref 0.0–0.2)

## 2018-11-17 LAB — TROPONIN I (HIGH SENSITIVITY): Troponin I (High Sensitivity): 9 ng/L (ref <18)

## 2018-11-17 MED ORDER — CEPHALEXIN 500 MG PO CAPS: 500.0000 mg | ORAL_CAPSULE | Freq: Two times a day (BID) | ORAL | 0 refills | Status: DC

## 2018-11-17 NOTE — ED Provider Notes (Signed)
[facility] Emergency Department Provider Note  ____________________________________________   None    (approximate)   I have reviewed the triage vital signs and the nursing notes.   Patient has been triaged with a MSE exam performed by myself at a minimum. Based on symptoms and screening exam, patient may receive a more in-depth exam, labs, imaging as detailed below. Patients have been advised of this setting and exam type at the time of patient interview.    HISTORY  Chief Complaint No chief complaint on file.    HPI Franklin Baker is a 63 y.o. male presents to the emergency department with a complaint of syncope yesterday. Patient used the restroom, stood up and experienced a syncopal episode. Patient injured his hand but did not hit his head. No CP, SOB, abd pain, N/V/D. No recurrent syncope. No HX of same. No HA currently but patient experienced HA prior to syncope.  Patient will receive a medical screening exam as detailed below.  Based off of this exam, more in depth exam, labs, imaging will be performed as needed for complaint.  Patient care will be eventually transferred to another provider in the emergency department for final exam, diagnosis and disposition.    Past Medical History:  Diagnosis Date  . Allergy   . Anxiety   . Cataract    early cataracts   . Hyperlipidemia   . Vertigo, benign positional     Patient Active Problem List   Diagnosis Date Noted  . Acne vulgaris 09/01/2016  . Hyperlipidemia 09/01/2016  . Erectile dysfunction 08/29/2016  . Vertigo, benign positional 08/29/2016  . Anxiety disorder, unspecified 08/29/2016    Past Surgical History:  Procedure Laterality Date  . COLONOSCOPY     last colon 2014- normal     Prior to Admission medications   Medication Sig Start Date End Date Taking? Authorizing Provider  Cholecalciferol (VITAMIN D) 2000 units CAPS Take 1 capsule by mouth daily.    [provider]   clindamycin-benzoyl peroxide (BENZACLIN) gel APPLY TOPICALLY TO THE AFFECTED AREA AS NEEDED 07/08/18   [place], Betty G, MD  diazepam (VALIUM) 5 MG tablet TAKE 1 TABLET BY MOUTH EVERY 12 HOURS AS NEEDED FOR ANXIETY. NO MORE THAN 40 TABLETS A MONTH 08/10/18   [place], Betty G, MD  fluticasone [facility]) 50 MCG/ACT nasal spray Place 1 spray into both nostrils daily.    [provider]  Omega-3 Fatty Acids (FISH OIL TRIPLE STRENGTH) 1400 MG CAPS Take 1 capsule by mouth daily.    [provider]  sildenafil (VIAGRA) 100 MG tablet TAKE 1 TABLET BY MOUTH DAILY AS NEEDED FOR ERECTILE DYSFUNCTION 08/10/18   [place], Betty G, MD  simvastatin (ZOCOR) 20 MG tablet TAKE 1 TABLET BY MOUTH AT BEDTIME 09/29/18   [place], Betty G, MD    Allergies Patient has no known allergies.  Family History  Problem Relation Age of Onset  . Cancer Mother   . Breast cancer Mother   . Colon cancer Sister 88       died age 12  . Colon polyps Neg Hx   . Esophageal cancer Neg Hx   . Rectal cancer Neg Hx   . Stomach cancer Neg Hx     Social History Social History   Tobacco Use  . Smoking status: Never Smoker  . Smokeless tobacco: Never Used  Substance Use Topics  . Alcohol use: Yes    Comment: occasionally   . Drug use: No  Review of Systems Constitutional: No fever ENT: no nasal congestion/rhinorhea. no sore throat Cardiovascular: no chest pain. Respiratory: no cough. no shortness of breath/difficulty breathing Gastroenterology: No abdominal pain Musculoskeletal:  R hand pain Integumentary: Negative for rash. Neurological: Positive for syncope, HA prior to syncope   ____________________________________________   PHYSICAL EXAM:  VITAL SIGNS: ED Triage Vitals  Enc Vitals Group     BP      Pulse      Resp      Temp      Temp src      SpO2      Weight      Height      Head Circumference      Peak Flow      Pain Score      Pain Loc      Pain Edu?      Excl. in [place]?      Constitutional: Alert and oriented. Generally well appearing and in no acute distress. Eyes: Conjunctivae are normal.  Nose: No significant congestion/rhinnorhea. Mouth: No gross oropharyngeal edema.  Neck: No stridor.  No meningeal signs.   Cardiovascular: Grossly normal heart sounds. Respiratory: Normal respiratory effort without significant tachypnea and no observed retractions. Lungs CTAB Gastrointestinal: No significant visible abdominal wall findings.  Bowel sounds x4 quadrants. No tenderness to palpation. Musculoskeletal: No gross deformities of extremities. Neurologic:  Normal speech and language. No gross focal neurologic deficits are appreciated. CN II-XII grossly unremarkable Skin:  Skin is warm, dry and intact. No rash noted.    ____________________________________________   LABS (all labs ordered are listed, but only abnormal results are displayed)  Labs Reviewed - No data to display  ____________________________________________   RADIOLOGY   Official radiology report(s): No results found.  ____________________________________________    INITIAL IMPRESSION / MDM / ASSESSMENT AND PLAN / ED COURSE  As part of my medical decision making, I reviewed the following data within the [place] notes reviewed and incorporated, Notes from prior ED visits and [place] Controlled Substance Database      Clinical Impression: syncope   Plan: Labs, imaging, EKG  Patient has been screened based based on their arrival complaint, evaluated for an emergent condition, and at a minimum has received a medical screening exam.  At this time, patient will receive further work-up listed above as determined by medical screening exam.  Patient care will eventually be transferred to another provider in the emergency department for final diagnosis and disposition.    ____________________________________________  Note:  This document was prepared using Therapist, sports and may include unintentional dictation errors.    Darletta Moll, PA-C 11/17/18 1220    Earleen Newport, MD 11/17/18 1308

## 2018-11-17 NOTE — Discharge Instructions (Signed)
Please take antibiotics as prescribed for their entire course.  Please call the number provided to cardiology today to arrange for an appointment for Holter monitor testing.  Return to the emergency department for any further syncopal episodes, if you develop any chest pain, shortness of breath, or any other symptom personally concerning to yourself.

## 2018-11-17 NOTE — ED Notes (Signed)
Pt ambulatory to restroom

## 2018-11-17 NOTE — ED Provider Notes (Signed)
[facility] Emergency Department Provider Note  Time seen: 2:06 PM  I have reviewed the triage vital signs and the nursing notes.   HISTORY  Chief Complaint Loss of Consciousness   HPI Franklin Baker is a 63 y.o. male with a past medical history of allergy, anxiety, hyperlipidemia, presents to the emergency department after syncopal event yesterday.  According to the patient yesterday he had a bowel movement, got up from the toilet was washing his hands when he became very sweaty and had a brief syncopal event.  Does not believe he hit his head.   Patient did scrape his right hand.  Patient denies any chest pain at any point denies any shortness of breath at any point.  Denies any palpitations prior to the event or since.  Denies any recent illnesses fever cough congestion shortness of breath nausea vomiting or diarrhea.  Past Medical History:  Diagnosis Date  . Allergy   . Anxiety   . Cataract    early cataracts   . Hyperlipidemia   . Vertigo, benign positional     Patient Active Problem List   Diagnosis Date Noted  . Acne vulgaris 09/01/2016  . Hyperlipidemia 09/01/2016  . Erectile dysfunction 08/29/2016  . Vertigo, benign positional 08/29/2016  . Anxiety disorder, unspecified 08/29/2016    Past Surgical History:  Procedure Laterality Date  . COLONOSCOPY     last colon 2014- normal     Prior to Admission medications   Medication Sig Start Date End Date Taking? Authorizing Provider  Cholecalciferol (VITAMIN D) 2000 units CAPS Take 1 capsule by mouth daily.    [provider]  clindamycin-benzoyl peroxide (BENZACLIN) gel APPLY TOPICALLY TO THE AFFECTED AREA AS NEEDED 07/08/18   [place], Betty G, MD  diazepam (VALIUM) 5 MG tablet TAKE 1 TABLET BY MOUTH EVERY 12 HOURS AS NEEDED FOR ANXIETY. NO MORE THAN 40 TABLETS A MONTH 08/10/18   [place], Betty G, MD  fluticasone [facility]) 50 MCG/ACT nasal spray Place 1 spray into both nostrils daily.     [provider]  Omega-3 Fatty Acids (FISH OIL TRIPLE STRENGTH) 1400 MG CAPS Take 1 capsule by mouth daily.    [provider]  sildenafil (VIAGRA) 100 MG tablet TAKE 1 TABLET BY MOUTH DAILY AS NEEDED FOR ERECTILE DYSFUNCTION 08/10/18   [place], Betty G, MD  simvastatin (ZOCOR) 20 MG tablet TAKE 1 TABLET BY MOUTH AT BEDTIME 09/29/18   [place], Betty G, MD    No Known Allergies  Family History  Problem Relation Age of Onset  . Cancer Mother   . Breast cancer Mother   . Colon cancer Sister 58       died age 93  . Colon polyps Neg Hx   . Esophageal cancer Neg Hx   . Rectal cancer Neg Hx   . Stomach cancer Neg Hx     Social History Social History   Tobacco Use  . Smoking status: Never Smoker  . Smokeless tobacco: Never Used  Substance Use Topics  . Alcohol use: Yes    Comment: occasionally   . Drug use: No    Review of Systems Constitutional: Negative for fever. Cardiovascular: Negative for chest pain. Respiratory: Negative for shortness of breath. Gastrointestinal: Negative for abdominal pain Musculoskeletal: Negative for musculoskeletal complaints Neurological: Negative for headache All other ROS negative  ____________________________________________   PHYSICAL EXAM:  VITAL SIGNS: ED Triage Vitals  Enc Vitals Group     BP 11/17/18 1211 (!) 163/94  Pulse Rate 11/17/18 1211 79     Resp 11/17/18 1211 16     Temp 11/17/18 1211 98.8 F (37.1 C)     Temp Source 11/17/18 1211 Oral     SpO2 11/17/18 1211 100 %     Weight 11/17/18 1212 175 lb (79.4 kg)     Height 11/17/18 1212 6' (1.829 m)     Head Circumference --      Peak Flow --      Pain Score 11/17/18 1212 0     Pain Loc --      Pain Edu? --      Excl. in [place]? --     Constitutional: Alert and oriented. Well appearing and in no distress. Eyes: Normal exam ENT      Head: Normocephalic and atraumatic.      Mouth/Throat: Mucous membranes are moist. Cardiovascular: Normal rate, regular  rhythm.  Respiratory: Normal respiratory effort without tachypnea nor retractions. Breath sounds are clear Gastrointestinal: Soft and nontender. No distention.  Musculoskeletal: Nontender with normal range of motion in all extremities. Neurologic:  Normal speech and language. No gross focal neurologic deficits Skin:  Skin is warm, dry and intact.  Psychiatric: Mood and affect are normal.   ____________________________________________    EKG  EKG viewed and interpreted by myself shows a normal sinus rhythm at 75 bpm with a narrow QRS, normal axis, normal intervals, no concerning ST changes  ____________________________________________    RADIOLOGY  X-ray negative for fracture. CT negative for acute abnormality  ____________________________________________   INITIAL IMPRESSION / ASSESSMENT AND PLAN / ED COURSE  Pertinent labs & imaging results that were available during my care of the patient were reviewed by me and considered in my medical decision making (see chart for details).   Patient presents to the emergency department after a likely syncopal event yesterday.  Patient's work-up is overall reassuring, slight leukocytosis otherwise labs are largely at baseline for the patient.  X-ray is negative, CT scan head is negative.  EKG is reassuring.  Urinalysis is pending at this time although patient denies any urinary symptoms.  I discussed with the patient given his overall reassuring work-up he would likely be discharged with cardiology follow-up for a Holter monitor.  Patient is agreeable to plan of care.  I also discussed my typical syncope return precautions.  Patient does have white blood cells in his urinalysis greater than 50 no bacteria we will cover with antibiotics and send a urine culture as a precaution.  Patient agreeable to plan of care.  Patient will follow-up with cardiology for Holter monitor and his PCP.  Franklin Baker was evaluated in Emergency Department on  11/17/2018 for the symptoms described in the history of present illness. He was evaluated in the context of the global COVID-19 pandemic, which necessitated consideration that the patient might be at risk for infection with the SARS-CoV-2 virus that causes COVID-19. Institutional protocols and algorithms that pertain to the evaluation of patients at risk for COVID-19 are in a state of rapid change based on information released by regulatory bodies including the CDC and federal and state organizations. These policies and algorithms were followed during the patient's care in the ED.  ____________________________________________   FINAL CLINICAL IMPRESSION(S) / ED DIAGNOSES  Syncope Urinary tract infection   Harvest Dark, MD 11/17/18 1438

## 2018-11-17 NOTE — ED Triage Notes (Signed)
Patient reports passing out yesterday at home and falling to floor. C/o soreness to right hand only. Denies blood thinners

## 2018-11-20 LAB — URINE CULTURE: Culture: >=100000 — AB

## 2018-11-21 NOTE — Progress Notes (Signed)
Brief Pharmacy Note  Patient is a 63 y/o M who presented to [facility]  ED on 11/9 c/o syncopal event. Work-up un-revealing besides UA concerning for UTI and patient was discharged on cephalexin. Urine culture has resulted >100k colonies/mL Proteus mirabilis (cefazolin sensitive). Patient is appropriately covered on discharge antibiotics. Will not pursue further intervention.  [place] Resident 21 November 2018

## 2018-12-08 ENCOUNTER — Other Ambulatory Visit: Payer: Self-pay

## 2018-12-08 ENCOUNTER — Encounter: Payer: Self-pay | Admitting: Family Medicine

## 2018-12-08 ENCOUNTER — Ambulatory Visit: Payer: No Typology Code available for payment source | Admitting: Family Medicine

## 2018-12-08 VITALS — BP 130/80 | HR 94 | Temp 97.6°F | Resp 12 | Ht 72.0 in | Wt 181.6 lb

## 2018-12-08 DIAGNOSIS — D72829 Elevated white blood cell count, unspecified: Secondary | ICD-10-CM | POA: Diagnosis not present

## 2018-12-08 DIAGNOSIS — E785 Hyperlipidemia, unspecified: Secondary | ICD-10-CM

## 2018-12-08 DIAGNOSIS — R7401 Elevation of levels of liver transaminase levels: Secondary | ICD-10-CM

## 2018-12-08 DIAGNOSIS — R300 Dysuria: Secondary | ICD-10-CM

## 2018-12-08 DIAGNOSIS — F419 Anxiety disorder, unspecified: Secondary | ICD-10-CM

## 2018-12-08 DIAGNOSIS — R55 Syncope and collapse: Secondary | ICD-10-CM

## 2018-12-08 LAB — LIPID PANEL
Cholesterol: 231 mg/dL — ABNORMAL HIGH (ref 0–200)
HDL: 44.9 mg/dL (ref >39.00)
LDL Cholesterol: 166 mg/dL — ABNORMAL HIGH (ref 0–99)
NonHDL: 185.91
Total CHOL/HDL Ratio: 5
Triglycerides: 101 mg/dL (ref 0.0–149.0)
VLDL: 20.2 mg/dL (ref 0.0–40.0)

## 2018-12-08 LAB — URINALYSIS, ROUTINE W REFLEX MICROSCOPIC
Bilirubin Urine: NEGATIVE
Ketones, ur: NEGATIVE
Leukocytes,Ua: NEGATIVE
Nitrite: NEGATIVE
Specific Gravity, Urine: 1.01 (ref 1.000–1.030)
Total Protein, Urine: NEGATIVE
Urine Glucose: NEGATIVE
Urobilinogen, UA: 0.2 (ref 0.0–1.0)
pH: 6.5 (ref 5.0–8.0)

## 2018-12-08 LAB — CBC WITH DIFFERENTIAL/PLATELET
Basophils Absolute: 0 10*3/uL (ref 0.0–0.1)
Basophils Relative: 0.5 % (ref 0.0–3.0)
Eosinophils Absolute: 0.1 10*3/uL (ref 0.0–0.7)
Eosinophils Relative: 1.7 % (ref 0.0–5.0)
HCT: 42.5 % (ref 39.0–52.0)
Hemoglobin: 14.2 g/dL (ref 13.0–17.0)
Lymphocytes Relative: 52.5 % — ABNORMAL HIGH (ref 12.0–46.0)
Lymphs Abs: 2.4 10*3/uL (ref 0.7–4.0)
MCHC: 33.5 g/dL (ref 30.0–36.0)
MCV: 85.9 fl (ref 78.0–100.0)
Monocytes Absolute: 0.3 10*3/uL (ref 0.1–1.0)
Monocytes Relative: 6.2 % (ref 3.0–12.0)
Neutro Abs: 1.8 10*3/uL (ref 1.4–7.7)
Neutrophils Relative %: 39.1 % — ABNORMAL LOW (ref 43.0–77.0)
Platelets: 389 10*3/uL (ref 150.0–400.0)
RBC: 4.95 Mil/uL (ref 4.22–5.81)
RDW: 14 % (ref 11.5–15.5)
WBC: 4.5 10*3/uL (ref 4.0–10.5)

## 2018-12-08 NOTE — Progress Notes (Signed)
HPI:   Franklin Baker is a 63 y.o. male, who is here today for chronic disease management.  He was last seen on 06/06/2018. He was recently seen in the ER, 11/17/2018, due to syncopal episode. He denies prior history of syncope. He states that after a long walk he had a bowel movement, felt diaphoretic while he was washing his hands. He is not sure about time he was unconscious. He denies unusual headache, visual changes, chest pain, dyspnea, palpitation, wheezing, cough, urine/bowel incontinence,or postictal-like state.  Is concerned about still having UTI. He completed treatment with cephalexin. "Little" burning occasionally and odorous urine. Negative for urgency, increased urinary frequency, or gross hematuria.  Urine culture grew Proteus mirabilis > 100,000 CFU. He denies risk factors for STDs.  He has not had fever, chills, back pain, rectal pain, urine dribbling or changes in urine stream. He follows with urologist, next appointment in a few months.  Hyperlipidemia: Currently he is on simvastatin 20 mg daily. He follows a low-fat diet. He has tolerated the medication well.  Lab Results  Component Value Date   CHOL 213 (H) 07/01/2017   HDL 51.20 07/01/2017   LDLCALC 142 (H) 07/01/2017   TRIG 101.0 07/01/2017   CHOLHDL 4 07/01/2017   Concerned about elevated WBCs. She denies abnormal weight loss or night sweats.  Lab Results  Component Value Date   WBC 15.3 (H) 11/17/2018   HGB 14.0 11/17/2018   HCT 40.9 11/17/2018   MCV 83.0 11/17/2018   PLT 253 11/17/2018    Lab Results  Component Value Date   CREATININE 0.91 11/17/2018   BUN 15 11/17/2018   NA 139 11/17/2018   K 3.6 11/17/2018   CL 105 11/17/2018   CO2 25 11/17/2018   Elevated transaminases: Denies high alcohol intake. Negative for abdominal pain, nausea, vomiting, changes in bowel habits, or jaundice.  Lab Results  Component Value Date   ALT 67 (H) 11/17/2018   AST 42 (H) 11/17/2018   ALKPHOS 81 11/17/2018   BILITOT 1.0 11/17/2018    Review of Systems  Constitutional: Negative for activity change, appetite change and fatigue.  HENT: Negative for nosebleeds and sore throat.   Cardiovascular: Negative for leg swelling.  Genitourinary: Negative for discharge, scrotal swelling and testicular pain.  Musculoskeletal: Negative for gait problem and myalgias.  Skin: Negative for pallor and rash.  Neurological: Negative for seizures, facial asymmetry, speech difficulty and weakness.  Hematological: Negative for adenopathy. Does not bruise/bleed easily.  Psychiatric/Behavioral: Negative for confusion. The patient is nervous/anxious.   Rest of ROS, see pertinent positives sand negatives in HPI  Current Outpatient Medications on File Prior to Visit  Medication Sig Dispense Refill   cephALEXin (KEFLEX) 500 MG capsule Take 1 capsule (500 mg total) by mouth 2 (two) times daily. 14 capsule 0   Cholecalciferol (VITAMIN D) 2000 units CAPS Take 1 capsule by mouth daily.     clindamycin-benzoyl peroxide (BENZACLIN) gel APPLY TOPICALLY TO THE AFFECTED AREA AS NEEDED 100 g 2   diazepam (VALIUM) 5 MG tablet TAKE 1 TABLET BY MOUTH EVERY 12 HOURS AS NEEDED FOR ANXIETY. NO MORE THAN 40 TABLETS A MONTH 40 tablet 3   fluticasone (FLONASE) 50 MCG/ACT nasal spray Place 1 spray into both nostrils daily.     Omega-3 Fatty Acids (FISH OIL TRIPLE STRENGTH) 1400 MG CAPS Take 1 capsule by mouth daily.     sildenafil (VIAGRA) 100 MG tablet TAKE 1 TABLET BY MOUTH DAILY AS NEEDED  FOR ERECTILE DYSFUNCTION 18 tablet 3   simvastatin (ZOCOR) 20 MG tablet TAKE 1 TABLET BY MOUTH AT BEDTIME 90 tablet 2   No current facility-administered medications on file prior to visit.      Past Medical History:  Diagnosis Date   Allergy    Anxiety    Cataract    early cataracts    Hyperlipidemia    Vertigo, benign positional    No Known Allergies  Social History   Socioeconomic History   Marital  status: Married    Spouse name: Not on file   Number of children: Not on file   Years of education: Not on file   Highest education level: Not on file  Occupational History   Not on file  Social Needs   Financial resource strain: Not on file   Food insecurity    Worry: Not on file    Inability: Not on file   Transportation needs    Medical: Not on file    Non-medical: Not on file  Tobacco Use   Smoking status: Never Smoker   Smokeless tobacco: Never Used  Substance and Sexual Activity   Alcohol use: Yes    Comment: occasionally    Drug use: No   Sexual activity: Not on file  Lifestyle   Physical activity    Days per week: Not on file    Minutes per session: Not on file   Stress: Not on file  Relationships   Social connections    Talks on phone: Not on file    Gets together: Not on file    Attends religious service: Not on file    Active member of club or organization: Not on file    Attends meetings of clubs or organizations: Not on file    Relationship status: Not on file  Other Topics Concern   Not on file  Social History Narrative   Not on file    Vitals:   12/08/18 0755  BP: 130/80  Pulse: 94  Resp: 12  Temp: 97.6 F (36.4 C)  SpO2: 97%   Body mass index is 24.63 kg/m.   Physical Exam  Nursing note reviewed. Constitutional: He is oriented to person, place, and time. He appears well-developed and well-nourished. No distress.  HENT:  Head: Normocephalic and atraumatic.  Mouth/Throat: Oropharynx is clear and moist and mucous membranes are normal.  Eyes: Pupils are equal, round, and reactive to light. Conjunctivae are normal.  Cardiovascular: Normal rate and regular rhythm.  No murmur heard. Pulses:      Posterior tibial pulses are 2+ on the right side and 2+ on the left side.  Respiratory: Effort normal and breath sounds normal. No respiratory distress.  GI: Soft. He exhibits no mass. There is no hepatomegaly. There is no abdominal  tenderness. There is no CVA tenderness.  Musculoskeletal:        General: No edema.  Lymphadenopathy:    He has no cervical adenopathy.  Neurological: He is alert and oriented to person, place, and time. He has normal strength. No cranial nerve deficit. Gait normal.  Skin: Skin is warm. No rash noted. No erythema.  Psychiatric: He has a normal mood and affect. Cognition and memory are normal.  Well groomed, good eye contact.   ASSESSMENT AND PLAN:   Mr. Regino BellowRicky M Wunschel was seen today for chronic disease management.  Orders Placed This Encounter  Procedures   CBC with Differential/Platelet   Lipid panel   Urinalysis, Routine w  reflex microscopic   Lab Results  Component Value Date   WBC 4.5 12/08/2018   HGB 14.2 12/08/2018   HCT 42.5 12/08/2018   MCV 85.9 12/08/2018   PLT 389.0 12/08/2018   Lab Results  Component Value Date   CHOL 231 (H) 12/08/2018   HDL 44.90 12/08/2018   LDLCALC 166 (H) 12/08/2018   TRIG 101.0 12/08/2018   CHOLHDL 5 12/08/2018    Hyperlipidemia Continue simvastatin 20 mg daily. Low-fat diet to continue. Further recommendation will be given according to lab results.  Anxiety disorder, unspecified Problem is stable. Continue diazepam 5 mg twice daily as needed. Follow-up in 6 months.  Dysuria UA ordered today. Proteus mirabilis sensitive to Cipro and Bactrim. Other possible etiologies discussed.  Leukocytosis, unspecified type Mild. Further recommendation will be given according to CBC results.  Elevated transaminase level Mild. We discussed possible etiologies. We will plan on checking LFTs next visit.  Syncope, unspecified syncope type ?  Vasovagal. I will obtain further work-up needed at this time. Adequate hydration. Instructed about warning signs.   Return in about 6 months (around 06/07/2019) for cpe.   Kierria Feigenbaum G. [place], MD  [facility]. [place] office.

## 2018-12-08 NOTE — Assessment & Plan Note (Signed)
Problem is stable. Continue diazepam 5 mg twice daily as needed. Follow-up in 6 months.

## 2018-12-08 NOTE — Patient Instructions (Signed)
A few things to remember from today's visit:   Dysuria - Plan: Urinalysis, Routine w reflex microscopic  Hyperlipidemia, unspecified hyperlipidemia type - Plan: Lipid panel  Anxiety disorder, unspecified type  Leukocytosis, unspecified type - Plan: CBC with Differential/Platelet  Elevated transaminase level  No changes today. We will plan on checking your liver test next visit.  Please be sure medication list is accurate. If a new problem present, please set up appointment sooner than planned today.

## 2018-12-08 NOTE — Assessment & Plan Note (Signed)
Continue simvastatin 20 mg daily. Low-fat diet to continue. Further recommendation will be given according to lab results.

## 2018-12-09 ENCOUNTER — Encounter: Payer: Self-pay | Admitting: Family Medicine

## 2018-12-09 MED ORDER — SIMVASTATIN 40 MG PO TABS: 40.0000 mg | ORAL_TABLET | Freq: Every day | ORAL | 3 refills | Status: DC

## 2018-12-15 ENCOUNTER — Other Ambulatory Visit: Payer: Self-pay

## 2018-12-15 DIAGNOSIS — H8110 Benign paroxysmal vertigo, unspecified ear: Secondary | ICD-10-CM

## 2018-12-15 DIAGNOSIS — F419 Anxiety disorder, unspecified: Secondary | ICD-10-CM

## 2018-12-15 MED ORDER — DIAZEPAM 5 MG PO TABS: ORAL_TABLET | ORAL | 3 refills | Status: DC

## 2019-01-14 ENCOUNTER — Other Ambulatory Visit: Payer: Self-pay

## 2019-01-14 DIAGNOSIS — L700 Acne vulgaris: Secondary | ICD-10-CM

## 2019-01-14 MED ORDER — CLINDAMYCIN PHOS-BENZOYL PEROX 1-5 % EX GEL: CUTANEOUS | 2 refills | Status: DC

## 2019-03-17 ENCOUNTER — Telehealth (INDEPENDENT_AMBULATORY_CARE_PROVIDER_SITE_OTHER): Payer: No Typology Code available for payment source | Admitting: Family Medicine

## 2019-03-17 ENCOUNTER — Encounter: Payer: Self-pay | Admitting: Family Medicine

## 2019-03-17 DIAGNOSIS — K0889 Other specified disorders of teeth and supporting structures: Secondary | ICD-10-CM

## 2019-03-17 DIAGNOSIS — K047 Periapical abscess without sinus: Secondary | ICD-10-CM | POA: Diagnosis not present

## 2019-03-17 MED ORDER — TRAMADOL HCL 50 MG PO TABS: 50.0000 mg | ORAL_TABLET | Freq: Two times a day (BID) | ORAL | 0 refills | Status: AC | PRN

## 2019-03-17 MED ORDER — AMOXICILLIN-POT CLAVULANATE 875-125 MG PO TABS: 1.0000 | ORAL_TABLET | Freq: Two times a day (BID) | ORAL | 0 refills | Status: AC

## 2019-03-17 NOTE — Progress Notes (Signed)
Virtual Visit via Video Note   I connected with Mr Ciszewski on 03/17/19 by a video enabled telemedicine application and verified that I am speaking with the correct person using two identifiers.  Location patient: home Location provider:work office Persons participating in the virtual visit: patient, provider  I discussed the limitations of evaluation and management by telemedicine and the availability of in person appointments. The patient expressed understanding and agreed to proceed.   HPI: Mr Coxe is a 64 yo male with hx of anxiety,vertigo,and HLD c/o a day of toothache. This is a new problem and getting worse. Right lower last molar pain. Tooth has a crown, still in place. No hx of trauma.  He has noted mild gum edema and erythema. Negative for purulent drainage from gum.  Aching/throbbing constant pain, 6-7/10, no radiated. Alleviated by avoiding pressure. Exacerbated by eating and applying pressure. Also tender upon light touch or when he closes mouth. TMJ with normal ROM.  He has not noted fever, chills, sore throat, dysphagia, oral lesions, neck pain, or body aches.  Took Tylenol this morning, which helped temporarily.  His dentist is in [place], states that he cannot use his dental insurance here in [place] He usually sees his dentist every 6 months.  ROS: See pertinent positives and negatives per HPI.  Past Medical History:  Diagnosis Date  . Allergy   . Anxiety   . Cataract    early cataracts   . Hyperlipidemia   . Vertigo, benign positional     Past Surgical History:  Procedure Laterality Date  . COLONOSCOPY     last colon 2014- normal     Family History  Problem Relation Age of Onset  . Cancer Mother   . Breast cancer Mother   . Colon cancer Sister 78       died age 40  . Colon polyps Neg Hx   . Esophageal cancer Neg Hx   . Rectal cancer Neg Hx   . Stomach cancer Neg Hx     Social History   Socioeconomic History  . Marital status: Married   Spouse name: Not on file  . Number of children: Not on file  . Years of education: Not on file  . Highest education level: Not on file  Occupational History  . Not on file  Tobacco Use  . Smoking status: Never Smoker  . Smokeless tobacco: Never Used  Substance and Sexual Activity  . Alcohol use: Yes    Comment: occasionally   . Drug use: No  . Sexual activity: Not on file  Other Topics Concern  . Not on file  Social History Narrative  . Not on file   Social Determinants of Health   Financial Resource Strain:   . Difficulty of Paying Living Expenses:   Food Insecurity:   . Worried About Programme researcher, broadcasting/film/video in the Last Year:   . Barista in the Last Year:   Transportation Needs:   . Freight forwarder (Medical):   Marland Kitchen Lack of Transportation (Non-Medical):   Physical Activity:   . Days of Exercise per Week:   . Minutes of Exercise per Session:   Stress:   . Feeling of Stress :   Social Connections:   . Frequency of Communication with Friends and Family:   . Frequency of Social Gatherings with Friends and Family:   . Attends Religious Services:   . Active Member of Clubs or Organizations:   . Attends Club or  Organization Meetings:   Marland Kitchen Marital Status:   Intimate Partner Violence:   . Fear of Current or Ex-Partner:   . Emotionally Abused:   Marland Kitchen Physically Abused:   . Sexually Abused:    Current Outpatient Medications:  .  Cholecalciferol (VITAMIN D) 2000 units CAPS, Take 1 capsule by mouth daily., Disp: , Rfl:  .  clindamycin-benzoyl peroxide (BENZACLIN) gel, APPLY TOPICALLY TO THE AFFECTED AREA AS NEEDED, Disp: 100 g, Rfl: 2 .  diazepam (VALIUM) 5 MG tablet, TAKE 1 TABLET BY MOUTH EVERY 12 HOURS AS NEEDED FOR ANXIETY. NO MORE THAN 40 TABLETS A MONTH, Disp: 40 tablet, Rfl: 3 .  fluticasone (FLONASE) 50 MCG/ACT nasal spray, Place 1 spray into both nostrils daily., Disp: , Rfl:  .  Omega-3 Fatty Acids (FISH OIL TRIPLE STRENGTH) 1400 MG CAPS, Take 1 capsule by mouth  daily., Disp: , Rfl:  .  sildenafil (VIAGRA) 100 MG tablet, TAKE 1 TABLET BY MOUTH DAILY AS NEEDED FOR ERECTILE DYSFUNCTION, Disp: 18 tablet, Rfl: 3 .  simvastatin (ZOCOR) 40 MG tablet, Take 1 tablet (40 mg total) by mouth at bedtime., Disp: 90 tablet, Rfl: 3  EXAM:  VITALS per patient if applicable:N/A  GENERAL: alert, oriented, appears well and in no acute distress  HEENT: atraumatic, conjunttiva clear, no obvious abnormalities on inspection of external nose and ears Right lower last molar with tenderness when he palpates area. I cannot appreciate erythema, maybe some edema.  NECK: normal movements of the head and neck. No swollen glands when instructed to palpate submandibular and submental area.  LUNGS: on inspection no signs of respiratory distress, breathing rate appears normal, no obvious gross SOB, gasping or wheezing  CV: no obvious cyanosis  PSYCH/NEURO: pleasant and cooperative, no obvious depression or anxiety, speech and thought processing grossly intact  ASSESSMENT AND PLAN:  Discussed the following assessment and plan:  Tooth ache - Plan: traMADol (ULTRAM) 50 MG tablet We discussed possible etiologies. Tramadol side effects discussed, he can take it with Tylenol. Instructed to call his dentist this afternoon to arrange appt.  Dental abscess - Plan: amoxicillin-clavulanate (AUGMENTIN) 875-125 MG tablet Will treat as infectious process. Side effects of abx discussed. Instructed about warning signs. Follow with dentist before completing abs treatment.    I discussed the assessment and treatment plan with the patient. Mr Rosenwald was provided an opportunity to ask questions and all were answered. He agreed with the plan and demonstrated an understanding of the instructions.   Return if symptoms worsen or fail to improve.    Evangelyne Loja [place], MD

## 2019-04-10 ENCOUNTER — Ambulatory Visit: Payer: BLUE CROSS/BLUE SHIELD | Attending: Internal Medicine

## 2019-04-10 DIAGNOSIS — Z23 Encounter for immunization: Secondary | ICD-10-CM

## 2019-04-10 NOTE — Progress Notes (Signed)
   Covid-19 Vaccination Clinic  Name:  WATARU MCCOWEN    MRN: [id number] DOB: 1955-03-26  04/10/2019  Mr. Juday was observed post Covid-19 immunization for 15 minutes without incident. He was provided with Vaccine Information Sheet and instruction to access the V-Safe system.   Mr. Treichler was instructed to call 911 with any severe reactions post vaccine: Marland Kitchen Difficulty breathing  . Swelling of face and throat  . A fast heartbeat  . A bad rash all over body  . Dizziness and weakness   Immunizations Administered    Name Date Dose VIS Date Route   Pfizer COVID-19 Vaccine 04/10/2019 10:00 AM 0.3 mL 12/19/2018 Intramuscular   Manufacturer: [employer], [employer]   Lot: [id number]   NDC: [address]-7

## 2019-04-15 ENCOUNTER — Other Ambulatory Visit: Payer: Self-pay | Admitting: Family Medicine

## 2019-04-15 DIAGNOSIS — F419 Anxiety disorder, unspecified: Secondary | ICD-10-CM

## 2019-04-15 DIAGNOSIS — H8110 Benign paroxysmal vertigo, unspecified ear: Secondary | ICD-10-CM

## 2019-04-22 ENCOUNTER — Other Ambulatory Visit (HOSPITAL_COMMUNITY): Payer: Self-pay | Admitting: Urology

## 2019-04-22 ENCOUNTER — Other Ambulatory Visit: Payer: Self-pay | Admitting: Urology

## 2019-04-22 DIAGNOSIS — R972 Elevated prostate specific antigen [PSA]: Secondary | ICD-10-CM

## 2019-05-04 ENCOUNTER — Ambulatory Visit: Payer: BLUE CROSS/BLUE SHIELD

## 2019-05-05 ENCOUNTER — Ambulatory Visit: Payer: BLUE CROSS/BLUE SHIELD | Attending: Internal Medicine

## 2019-05-05 DIAGNOSIS — Z23 Encounter for immunization: Secondary | ICD-10-CM

## 2019-05-05 NOTE — Progress Notes (Signed)
   Covid-19 Vaccination Clinic  Name:  Franklin Baker    MRN: [id number] DOB: 1955/11/19  05/05/2019  [name] was observed post Covid-19 immunization for 15 minutes without incident. He was provided with Vaccine Information Sheet and instruction to access the V-Safe system.   [name] was instructed to call 911 with any severe reactions post vaccine: Marland Kitchen Difficulty breathing  . Swelling of face and throat  . A fast heartbeat  . A bad rash all over body  . Dizziness and weakness   Immunizations Administered    Name Date Dose VIS Date Route   Pfizer COVID-19 Vaccine 05/05/2019  9:50 AM 0.3 mL 03/04/2018 Intramuscular   Manufacturer: [employer], [employer]   Lot: [id number]   NDC: [address]-3

## 2019-05-13 ENCOUNTER — Ambulatory Visit
Admission: RE | Admit: 2019-05-13 | Discharge: 2019-05-13 | Disposition: A | Discharge status: Home / Self Care | Payer: BLUE CROSS/BLUE SHIELD | Source: Ambulatory Visit | Attending: Urology | Admitting: Urology

## 2019-05-13 ENCOUNTER — Other Ambulatory Visit: Payer: Self-pay

## 2019-05-13 DIAGNOSIS — R972 Elevated prostate specific antigen [PSA]: Secondary | ICD-10-CM | POA: Insufficient documentation

## 2019-05-13 MED ORDER — GADOBUTROL 1 MMOL/ML IV SOLN
7.5000 mL | Freq: Once | INTRAVENOUS | Status: AC | PRN
  Administered 2019-05-13: 7.5 mL via INTRAVENOUS

## 2019-07-17 ENCOUNTER — Other Ambulatory Visit: Payer: Self-pay | Admitting: Family Medicine

## 2019-07-17 DIAGNOSIS — F419 Anxiety disorder, unspecified: Secondary | ICD-10-CM

## 2019-07-17 DIAGNOSIS — H8110 Benign paroxysmal vertigo, unspecified ear: Secondary | ICD-10-CM

## 2019-07-17 NOTE — Telephone Encounter (Signed)
Rx last filled 06/19/19 F/U appt in 11/21.

## 2019-08-17 ENCOUNTER — Other Ambulatory Visit: Payer: Self-pay | Admitting: Family Medicine

## 2019-08-17 DIAGNOSIS — N529 Male erectile dysfunction, unspecified: Secondary | ICD-10-CM

## 2019-08-17 DIAGNOSIS — L700 Acne vulgaris: Secondary | ICD-10-CM

## 2019-11-10 ENCOUNTER — Encounter: Payer: Self-pay | Admitting: Family Medicine

## 2019-11-10 ENCOUNTER — Other Ambulatory Visit: Payer: Self-pay

## 2019-11-10 ENCOUNTER — Ambulatory Visit (INDEPENDENT_AMBULATORY_CARE_PROVIDER_SITE_OTHER): Payer: No Typology Code available for payment source | Admitting: Family Medicine

## 2019-11-10 VITALS — BP 118/74 | HR 63 | Temp 97.8°F | Resp 16 | Ht 72.0 in | Wt 189.2 lb

## 2019-11-10 DIAGNOSIS — Z0000 Encounter for general adult medical examination without abnormal findings: Secondary | ICD-10-CM

## 2019-11-10 DIAGNOSIS — Z1329 Encounter for screening for other suspected endocrine disorder: Secondary | ICD-10-CM

## 2019-11-10 DIAGNOSIS — H8110 Benign paroxysmal vertigo, unspecified ear: Secondary | ICD-10-CM

## 2019-11-10 DIAGNOSIS — F419 Anxiety disorder, unspecified: Secondary | ICD-10-CM

## 2019-11-10 DIAGNOSIS — E785 Hyperlipidemia, unspecified: Secondary | ICD-10-CM | POA: Diagnosis not present

## 2019-11-10 DIAGNOSIS — Z13228 Encounter for screening for other metabolic disorders: Secondary | ICD-10-CM

## 2019-11-10 DIAGNOSIS — Z23 Encounter for immunization: Secondary | ICD-10-CM

## 2019-11-10 DIAGNOSIS — Z130 Encounter for screening for diseases of the blood and blood-forming organs and certain disorders involving the immune mechanism: Secondary | ICD-10-CM

## 2019-11-10 MED ORDER — DIAZEPAM 5 MG PO TABS: ORAL_TABLET | ORAL | 3 refills | Status: DC

## 2019-11-10 NOTE — Patient Instructions (Addendum)
A few things to remember from today's visit:   Routine general medical examination at a health care facility  Need for influenza vaccination - Plan: Flu Vaccine QUAD 36+ mos IM  Screening for endocrine, metabolic and immunity disorder - Plan: COMPLETE METABOLIC PANEL WITH GFR, Hemoglobin A1c  Hyperlipidemia, unspecified hyperlipidemia type - Plan: COMPLETE METABOLIC PANEL WITH GFR, Lipid panel, TSH  If you need refills please call your pharmacy. Do not use My Chart to request refills or for acute issues that need immediate attention.   Please be sure medication list is accurate. If a new problem present, please set up appointment sooner than planned today.  At least 150 minutes of moderate exercise per week, daily brisk walking for 15-30 min is a good exercise option. Healthy diet low in saturated (animal) fats and sweets and consisting of fresh fruits and vegetables, lean meats such as fish and white chicken and whole grains.  - Vaccines:  Tdap vaccine every 10 years.  Shingles vaccine recommended at age 35, could be given after 62 years of age but not sure about insurance coverage.  Pneumonia vaccines: Pneumovax at 665   -Screening recommendations for low/normal risk males:  Screening for diabetes at age 48 and every 3 years. Earlier screening if cardiovascular risk factors.  Lipid screening at 35 and every 3 years. Screening starts in younger males with cardiovascular risk factors.N/A  Colon cancer screening is now at age 68 but your insurance may not cover until age 4 .screening is recommended age 6.  Prostate cancer screening: some controversy, starts usually at 50: Rectal exam and PSA.N/A  Aortic Abdominal Aneurism once between 7 and 52 years old if ever smoker.  Also recommended:  1. Dental visit- Brush and floss your teeth twice daily; visit your dentist twice a year. 2. Eye doctor- Get an eye exam at least every 2 years. 3. Helmet use- Always wear a helmet  when riding a bicycle, motorcycle, rollerblading or skateboarding. 4. Safe sex- If you may be exposed to sexually transmitted infections, use a condom. 5. Seat belts- Seat belts can save your live; always wear one. 6. Smoke/Carbon Monoxide detectors- These detectors need to be installed on the appropriate level of your home. Replace batteries at least once a year. 7. Skin cancer- When out in the sun please cover up and use sunscreen 15 SPF or higher. 8. Violence- If anyone is threatening or hurting you, please tell your healthcare provider.  9. Drink alcohol in moderation- Limit alcohol intake to one drink or less per day. Never drink and drive.

## 2019-11-10 NOTE — Progress Notes (Signed)
HPI: [name] is a 64 y.o.male here today for his routine physical examination.  Last CPE: 06/06/18. He lives with his wife.  Regular exercise 3 or more times per week: Started going to the gym 1 months ago,goes 3-4 times per week. Following a healthful diet: Most of the time.  Chronic medical problems: anxiety,vertigo,HLD.  Immunization History  Administered Date(s) Administered  . Influenza,inj,Quad PF,6+ Mos 12/28/2016, 12/23/2017, 10/16/2018, 11/10/2019  . PFIZER SARS-COV-2 Vaccination 04/10/2019, 05/05/2019  . Zoster Recombinat (Shingrix) 06/06/2018, 08/08/2018   -Hep C screening: 07/01/17 NR.  Last colon cancer screening: 11/20/17 Last prostate ca screening: He follows with urologist due to elevated PSA.  Recently he had a prostate MRI, negative.  He has an appointment with his urologist this coming Tuesday.  Negative for tobacco use.  -Concerns and/or follow up today:  Hyperlipidemia: Currently he is on simvastatin 40 mg daily.  Lab Results  Component Value Date   CHOL 231 (H) 12/08/2018   HDL 44.90 12/08/2018   LDLCALC 166 (H) 12/08/2018   TRIG 101.0 12/08/2018   CHOLHDL 5 12/08/2018   Abnormal LFTs. Negative for abdominal pain, color changes in urine/stool, or jaundice. He does not drink alcohol.  Frequent.  Lab Results  Component Value Date   ALT 67 (H) 11/17/2018   AST 42 (H) 11/17/2018   ALKPHOS 81 11/17/2018   BILITOT 1.0 11/17/2018   Vertigo and anxiety: He is on Valium 5 mg bid. Medication is still helping and no side effects.  Review of Systems  Constitutional: Negative for activity change, appetite change and fever.  HENT: Negative for dental problem, nosebleeds and sore throat.   Eyes: Negative for redness and visual disturbance.  Respiratory: Negative for cough, shortness of breath and wheezing.   Cardiovascular: Negative for chest pain, palpitations and leg swelling.  Gastrointestinal: Negative for blood in stool.       No changes  in bowel habits.  Endocrine: Negative for cold intolerance, heat intolerance, polydipsia, polyphagia and polyuria.  Genitourinary: Negative for decreased urine volume, dysuria, genital sores, hematuria and testicular pain.  Musculoskeletal: Negative for gait problem and myalgias.  Skin: Negative for color change and rash.  Neurological: Negative for syncope, weakness and headaches.  Hematological: Negative for adenopathy. Does not bruise/bleed easily.  Psychiatric/Behavioral: Negative for confusion and sleep disturbance. The patient is not nervous/anxious.   All other systems reviewed and are negative.  Current Outpatient Medications on File Prior to Visit  Medication Sig Dispense Refill  . Cholecalciferol (VITAMIN D) 2000 units CAPS Take 1 capsule by mouth daily.    . clindamycin-benzoyl peroxide (BENZACLIN) gel APPLY TOPICALLY TO THE AFFECTED AREA AS NEEDED 100 g 2  . fluticasone (FLONASE) 50 MCG/ACT nasal spray Place 1 spray into both nostrils daily.    . Omega-3 Fatty Acids (FISH OIL TRIPLE STRENGTH) 1400 MG CAPS Take 1 capsule by mouth daily.    . sildenafil (VIAGRA) 100 MG tablet TAKE 1 TABLET BY MOUTH DAILY AS NEEDED FOR ERECTILE DYSFUNCTION 18 tablet 3  . simvastatin (ZOCOR) 40 MG tablet Take 1 tablet (40 mg total) by mouth at bedtime. 90 tablet 3   No current facility-administered medications on file prior to visit.   Past Medical History:  Diagnosis Date  . Allergy   . Anxiety   . Cataract    early cataracts   . Hyperlipidemia   . Vertigo, benign positional     Past Surgical History:  Procedure Laterality Date  . COLONOSCOPY  last colon 2014- normal     No Known Allergies  Family History  Problem Relation Age of Onset  . Cancer Mother   . Breast cancer Mother   . Colon cancer Sister 47       died age 4  . Colon polyps Neg Hx   . Esophageal cancer Neg Hx   . Rectal cancer Neg Hx   . Stomach cancer Neg Hx     Social History   Socioeconomic History  .  Marital status: Married    Spouse name: Not on file  . Number of children: Not on file  . Years of education: Not on file  . Highest education level: Not on file  Occupational History  . Not on file  Tobacco Use  . Smoking status: Never Smoker  . Smokeless tobacco: Never Used  Vaping Use  . Vaping Use: Never used  Substance and Sexual Activity  . Alcohol use: Yes    Comment: occasionally   . Drug use: No  . Sexual activity: Not on file  Other Topics Concern  . Not on file  Social History Narrative  . Not on file   Social Determinants of Health   Financial Resource Strain:   . Difficulty of Paying Living Expenses: Not on file  Food Insecurity:   . Worried About Programme researcher, broadcasting/film/video in the Last Year: Not on file  . Ran Out of Food in the Last Year: Not on file  Transportation Needs:   . Lack of Transportation (Medical): Not on file  . Lack of Transportation (Non-Medical): Not on file  Physical Activity:   . Days of Exercise per Week: Not on file  . Minutes of Exercise per Session: Not on file  Stress:   . Feeling of Stress : Not on file  Social Connections:   . Frequency of Communication with Friends and Family: Not on file  . Frequency of Social Gatherings with Friends and Family: Not on file  . Attends Religious Services: Not on file  . Active Member of Clubs or Organizations: Not on file  . Attends Banker Meetings: Not on file  . Marital Status: Not on file     Vitals:   11/10/19 0855  BP: 118/74  Pulse: 63  Resp: 16  Temp: 97.8 F (36.6 C)  SpO2: 99%   Body mass index is 25.67 kg/m.   Wt Readings from Last 3 Encounters:  11/10/19 189 lb 4 oz (85.8 kg)  12/08/18 181 lb 9.6 oz (82.4 kg)  11/17/18 175 lb (79.4 kg)      Physical Exam Vitals and nursing note reviewed.  Constitutional:      General: He is not in acute distress.    Appearance: He is well-developed.  HENT:     Head: Normocephalic and atraumatic.     Right Ear: External  ear normal. Tympanic membrane is not erythematous.     Left Ear: External ear normal.     Ears:     Comments: Right TM seen partially and not able to see left one. Cerumen excess.    Mouth/Throat:     Mouth: Mucous membranes are moist.     Pharynx: Oropharynx is clear.  Eyes:     Extraocular Movements: Extraocular movements intact.     Conjunctiva/sclera: Conjunctivae normal.     Pupils: Pupils are equal, round, and reactive to light.  Neck:     Thyroid: No thyromegaly.     Trachea: No tracheal deviation.  Cardiovascular:     Rate and Rhythm: Normal rate and regular rhythm.     Pulses:          Dorsalis pedis pulses are 2+ on the right side and 2+ on the left side.     Heart sounds: No murmur heard.   Pulmonary:     Effort: Pulmonary effort is normal. No respiratory distress.     Breath sounds: Normal breath sounds.  Abdominal:     Palpations: Abdomen is soft. There is no hepatomegaly or mass.     Tenderness: There is no abdominal tenderness.  Genitourinary:    Comments: Deferred to urologist. Musculoskeletal:        General: No tenderness.     Cervical back: Normal range of motion.     Comments: No major deformities appreciated and no signs of synovitis.  Lymphadenopathy:     Cervical: No cervical adenopathy.     Upper Body:     Right upper body: No supraclavicular adenopathy.     Left upper body: No supraclavicular adenopathy.  Skin:    General: Skin is warm.     Findings: No erythema.  Neurological:     General: No focal deficit present.     Mental Status: He is alert and oriented to person, place, and time.     Cranial Nerves: No cranial nerve deficit.     Sensory: No sensory deficit.     Coordination: Coordination normal.     Gait: Gait normal.     Deep Tendon Reflexes:     Reflex Scores:      Bicep reflexes are 2+ on the right side and 2+ on the left side.      Patellar reflexes are 2+ on the right side and 2+ on the left side. Psychiatric:        Mood and  Affect: Mood and affect normal.   ASSESSMENT AND PLAN:  [name] was seen today for annual exam.  Diagnoses and all orders for this visit:  Orders Placed This Encounter  Procedures  . Flu Vaccine QUAD 36+ mos IM  . COMPLETE METABOLIC PANEL WITH GFR  . Lipid panel  . Hemoglobin A1c  . TSH   Lab Results  Component Value Date   TSH 1.11 11/10/2019   Lab Results  Component Value Date   ALT 18 11/10/2019   AST 19 11/10/2019   ALKPHOS 81 11/17/2018   BILITOT 0.6 11/10/2019   Lab Results  Component Value Date   CREATININE 1.15 11/10/2019   BUN 18 11/10/2019   NA 140 11/10/2019   K 3.9 11/10/2019   CL 104 11/10/2019   CO2 27 11/10/2019   Lab Results  Component Value Date   CHOL 192 11/10/2019   HDL 51 11/10/2019   LDLCALC 121 (H) 11/10/2019   TRIG 101 11/10/2019   CHOLHDL 3.8 11/10/2019   Lab Results  Component Value Date   HGBA1C 5.8 (H) 11/10/2019   Serum creatinine: 1.15 mg/dL 36/46/80 3212 Estimated creatinine clearance: 73.1 mL/min  Routine general medical examination at a health care facility We discussed the importance of regular physical activity and healthy diet for prevention of chronic illness and/or complications.Encouraed to continue. Preventive guidelines reviewed. Vaccination up to date.  Next CPE in a year.  Need for influenza vaccination -     Flu Vaccine QUAD 36+ mos IM  Screening for endocrine, metabolic and immunity disorder -     Hemoglobin A1c -     COMPLETE METABOLIC PANEL  WITH GFR  Hyperlipidemia, unspecified hyperlipidemia type Continue Simvastatin 40 mg daily and low fat diet. May consider changing to a different statin medication. Further recommendations according to FLP results.  Benign paroxysmal positional vertigo, unspecified laterality Problem is well controled. No changes in Valium dose. PDMP reviewed.  -     diazepam (VALIUM) 5 MG tablet; TAKE 1 TABLET BY MOUTH EVERY 12 HOURS AS NEEDED FOR ANXIETY. NOT TO EXCEED 40  TABLETS PER MONTH  Anxiety disorder, unspecified type Stable. Continue Valium 5 mg bid prn.  -     diazepam (VALIUM) 5 MG tablet; TAKE 1 TABLET BY MOUTH EVERY 12 HOURS AS NEEDED FOR ANXIETY. NOT TO EXCEED 40 TABLETS PER MONTH   Return in 6 months (on 05/09/2020).   Cayleb Jarnigan G. [place], MD  [facility]. [place] office. A few things to remember from today's visit:   Routine general medical examination at a health care facility  Need for influenza vaccination - Plan: Flu Vaccine QUAD 36+ mos IM  Screening for endocrine, metabolic and immunity disorder - Plan: COMPLETE METABOLIC PANEL WITH GFR, Hemoglobin A1c  Hyperlipidemia, unspecified hyperlipidemia type - Plan: COMPLETE METABOLIC PANEL WITH GFR, Lipid panel, TSH  If you need refills please call your pharmacy. Do not use My Chart to request refills or for acute issues that need immediate attention.   Please be sure medication list is accurate. If a new problem present, please set up appointment sooner than planned today.  At least 150 minutes of moderate exercise per week, daily brisk walking for 15-30 min is a good exercise option. Healthy diet low in saturated (animal) fats and sweets and consisting of fresh fruits and vegetables, lean meats such as fish and white chicken and whole grains.  - Vaccines:  Tdap vaccine every 10 years.  Shingles vaccine recommended at age 1, could be given after 62 years of age but not sure about insurance coverage.  Pneumonia vaccines: Pneumovax at 52  -Screening recommendations for low/normal risk males:  Screening for diabetes at age 1 and every 3 years. Earlier screening if cardiovascular risk factors.  Lipid screening at 35 and every 3 years. Screening starts in younger males with cardiovascular risk factors.N/A  Colon cancer screening is now at age 15 but your insurance may not cover until age 32 .screening is recommended age 34.  Prostate cancer screening: some  controversy, starts usually at 50: Rectal exam and PSA.N/A  Aortic Abdominal Aneurism once between 49 and 25 years old if ever smoker.  Also recommended:  1. Dental visit- Brush and floss your teeth twice daily; visit your dentist twice a year. 2. Eye doctor- Get an eye exam at least every 2 years. 3. Helmet use- Always wear a helmet when riding a bicycle, motorcycle, rollerblading or skateboarding. 4. Safe sex- If you may be exposed to sexually transmitted infections, use a condom. 5. Seat belts- Seat belts can save your live; always wear one. 6. Smoke/Carbon Monoxide detectors- These detectors need to be installed on the appropriate level of your home. Replace batteries at least once a year. 7. Skin cancer- When out in the sun please cover up and use sunscreen 15 SPF or higher. 8. Violence- If anyone is threatening or hurting you, please tell your healthcare provider.  9. Drink alcohol in moderation- Limit alcohol intake to one drink or less per day. Never drink and drive.

## 2019-11-11 LAB — LIPID PANEL
Cholesterol: 192 mg/dL (ref <200)
HDL: 51 mg/dL (ref >=40)
LDL Cholesterol (Calc): 121 mg/dL (calc) — ABNORMAL HIGH
Non-HDL Cholesterol (Calc): 141 mg/dL (calc) — ABNORMAL HIGH (ref <130)
Total CHOL/HDL Ratio: 3.8 (calc) (ref <5.0)
Triglycerides: 101 mg/dL (ref <150)

## 2019-11-11 LAB — COMPLETE METABOLIC PANEL WITH GFR
AG Ratio: 1.9 (calc) (ref 1.0–2.5)
ALT: 18 U/L (ref 9–46)
AST: 19 U/L (ref 10–35)
Albumin: 4.5 g/dL (ref 3.6–5.1)
Alkaline phosphatase (APISO): 55 U/L (ref 35–144)
BUN: 18 mg/dL (ref 7–25)
CO2: 27 mmol/L (ref 20–32)
Calcium: 9.7 mg/dL (ref 8.6–10.3)
Chloride: 104 mmol/L (ref 98–110)
Creat: 1.15 mg/dL (ref 0.70–1.25)
GFR, Est African American: 79 mL/min/{1.73_m2} (ref >=60)
GFR, Est Non African American: 68 mL/min/{1.73_m2} (ref >=60)
Globulin: 2.4 g/dL (calc) (ref 1.9–3.7)
Glucose, Bld: 103 mg/dL — ABNORMAL HIGH (ref 65–99)
Potassium: 3.9 mmol/L (ref 3.5–5.3)
Sodium: 140 mmol/L (ref 135–146)
Total Bilirubin: 0.6 mg/dL (ref 0.2–1.2)
Total Protein: 6.9 g/dL (ref 6.1–8.1)

## 2019-11-11 LAB — HEMOGLOBIN A1C
Hgb A1c MFr Bld: 5.8 % of total Hgb — ABNORMAL HIGH (ref <5.7)
Mean Plasma Glucose: 120 (calc)
eAG (mmol/L): 6.6 (calc)

## 2019-11-11 LAB — TSH: TSH: 1.11 mIU/L (ref 0.40–4.50)

## 2019-11-14 MED ORDER — SIMVASTATIN 40 MG PO TABS: 40.0000 mg | ORAL_TABLET | Freq: Every day | ORAL | 3 refills | Status: DC

## 2020-03-22 ENCOUNTER — Other Ambulatory Visit: Payer: Self-pay | Admitting: Family Medicine

## 2020-03-22 DIAGNOSIS — F419 Anxiety disorder, unspecified: Secondary | ICD-10-CM

## 2020-03-22 DIAGNOSIS — H8110 Benign paroxysmal vertigo, unspecified ear: Secondary | ICD-10-CM

## 2020-03-22 NOTE — Telephone Encounter (Signed)
Last filled 02/21/20

## 2020-03-25 NOTE — Telephone Encounter (Signed)
A one month supply of Valium refilled by this provider as pt's pcp out of office.

## 2020-04-25 ENCOUNTER — Other Ambulatory Visit: Payer: Self-pay | Admitting: Family Medicine

## 2020-04-25 DIAGNOSIS — H8110 Benign paroxysmal vertigo, unspecified ear: Secondary | ICD-10-CM

## 2020-04-25 DIAGNOSIS — F419 Anxiety disorder, unspecified: Secondary | ICD-10-CM

## 2020-05-08 NOTE — Progress Notes (Signed)
HPI: Franklin Baker is a 65 y.o. male, who is here today for 6 months follow up.   He was last seen on 11/10/19 for his CPE. Since his last visit he has seen his urologist, underwent prostate biopsy and according to patient, it was negative for malignancy.  Next appointment with urologist next month.  He is on Valium 5 mg bid prn, started years ago to help with vertigo. He takes medication mainly for anxiety now. He is tolerating medication well with no side effects and it is still helping.  He thinks problem is stable. He has not had vertigo in 6 months. Negative for depressed mood. Sleeping 8-9 hours per night.  To day he is c/o LUE numbness "sometimes" for the past 3 months. It is mild. Usually he notices when walking but does not happen when he is exercising on the treadmill. Left-sided upper and cervical pain. Pain is mild most of the time, 4/10,achy pain. Not radiated. No hx of injuries but problems started when he was paining his house. Local heat and Tylenol, the latter one not all the time.  Negative for associated chest pain, dyspnea, palpitation, diaphoresis, cough,wheezing,or dizziness..  Today SBP mildly elevated. He checks his BP at home, recent reading 118/78.  Review of Systems  Constitutional: Negative for activity change, appetite change, fatigue and fever.  HENT: Negative for nosebleeds, sore throat and trouble swallowing.   Cardiovascular: Negative for leg swelling.  Gastrointestinal: Negative for abdominal pain, nausea and vomiting.  Genitourinary: Negative for decreased urine volume and hematuria.  Musculoskeletal: Negative for gait problem and joint swelling.  Skin: Negative for pallor and rash.  Neurological: Negative for syncope, weakness and headaches.  Hematological: Negative for adenopathy. Does not bruise/bleed easily.  Rest of ROS, see pertinent positives sand negatives in HPI  Current Outpatient Medications on File Prior to Visit   Medication Sig Dispense Refill  . Cholecalciferol (VITAMIN D) 2000 units CAPS Take 1 capsule by mouth daily.    . clindamycin-benzoyl peroxide (BENZACLIN) gel APPLY TOPICALLY TO THE AFFECTED AREA AS NEEDED 100 g 2  . diazepam (VALIUM) 5 MG tablet TAKE 1 TABLET BY MOUTH EVERY 12 HOURS AS NEEDED FOR ANXIETY. NOT TO EXCEED 40 TABLETS PER MONTH 40 tablet 3  . fluticasone (FLONASE) 50 MCG/ACT nasal spray Place 1 spray into both nostrils daily.    . Omega-3 Fatty Acids (FISH OIL TRIPLE STRENGTH) 1400 MG CAPS Take 1 capsule by mouth daily.    . sildenafil (VIAGRA) 100 MG tablet TAKE 1 TABLET BY MOUTH DAILY AS NEEDED FOR ERECTILE DYSFUNCTION 18 tablet 3  . simvastatin (ZOCOR) 40 MG tablet Take 1 tablet (40 mg total) by mouth at bedtime. 90 tablet 3   No current facility-administered medications on file prior to visit.     Past Medical History:  Diagnosis Date  . Allergy   . Anxiety   . Cataract    early cataracts   . Hyperlipidemia   . Vertigo, benign positional    No Known Allergies  Social History   Socioeconomic History  . Marital status: Married    Spouse name: Not on file  . Number of children: Not on file  . Years of education: Not on file  . Highest education level: Not on file  Occupational History  . Not on file  Tobacco Use  . Smoking status: Never Smoker  . Smokeless tobacco: Never Used  Vaping Use  . Vaping Use: Never used  Substance and  Sexual Activity  . Alcohol use: Yes    Comment: occasionally   . Drug use: No  . Sexual activity: Not on file  Other Topics Concern  . Not on file  Social History Narrative  . Not on file   Social Determinants of Health   Financial Resource Strain: Not on file  Food Insecurity: Not on file  Transportation Needs: Not on file  Physical Activity: Not on file  Stress: Not on file  Social Connections: Not on file   Vitals:   05/09/20 0858  BP: 140/82  Pulse: 67  Resp: 12  Temp: 98 F (36.7 C)  SpO2: 97%   Body mass  index is 25.28 kg/m.  Physical Exam Vitals and nursing note reviewed.  Constitutional:      General: He is not in acute distress.    Appearance: He is well-developed.  HENT:     Head: Normocephalic and atraumatic.     Mouth/Throat:     Mouth: Mucous membranes are moist.     Pharynx: Oropharynx is clear.  Eyes:     Conjunctiva/sclera: Conjunctivae normal.  Cardiovascular:     Rate and Rhythm: Normal rate and regular rhythm.     Pulses:          Dorsalis pedis pulses are 2+ on the right side and 2+ on the left side.     Heart sounds: No murmur heard.   Pulmonary:     Effort: Pulmonary effort is normal. No respiratory distress.     Breath sounds: Normal breath sounds.  Abdominal:     Palpations: Abdomen is soft. There is no hepatomegaly or mass.     Tenderness: There is no abdominal tenderness.  Musculoskeletal:     Cervical back: No tenderness or bony tenderness. No pain with movement. Decreased range of motion (Limitation of extension and left-sided rotation).     Thoracic back: No tenderness or bony tenderness.  Lymphadenopathy:     Cervical: No cervical adenopathy.  Skin:    General: Skin is warm.     Findings: No erythema or rash.  Neurological:     General: No focal deficit present.     Mental Status: He is alert and oriented to person, place, and time.     Cranial Nerves: No cranial nerve deficit.     Gait: Gait normal.  Psychiatric:     Comments: Well groomed, good eye contact.   ASSESSMENT AND PLAN:  Franklin Baker was seen today for 6 months follow-up.  Orders Placed This Encounter  Procedures  . DG Cervical Spine Complete  . Vitamin B12   Lab Results  Component Value Date   VITAMINB12 527 05/09/2020   Cervicalgia Most likely muscle stain was the precipitating factor,DDD cervical spine. Plain X ray order today. ROM exercises for now, will hold on PT for now.  Left upper extremity numbness We discussed possible etiologies. ?  Radiculopathy. After discussion of side effects he agrees with Prednisone taper. Instructed to take medication with breakfast. Neck X ray ordered today. We will need cervical MRI in 4-6 weeks of problem doe snot resolve,before if it gets worse.  -     predniSONE (DELTASONE) 20 MG tablet; 3 tabs for 3 days, 2 tabs for 3 days, 1 tabs for 3 days, and 1/2 tab for 3 days. Take tables together with breakfast.  Vertigo, benign positional Problem has been well controlled, no episodes for the past 6 months. Fall precautions. Continue Valium 5 mg bid prn.  Anxiety disorder, unspecified Problem is stable. Continue Valium 5 mg at bedtime as needed. [place] controled subs report reviewed, last filled 04/27/20.   Return in about 6 months (around 11/09/2020) for cpe.  Chiquita Heckert G. [place], MD  [facility] [place] office.  A few things to remember from today's visit:   Anxiety disorder, unspecified type  Left upper extremity numbness - Plan: Vitamin B12, predniSONE (DELTASONE) 20 MG tablet  Cervicalgia - Plan: DG Cervical Spine Complete, predniSONE (DELTASONE) 20 MG tablet  If you need refills please call your pharmacy. Do not use My Chart to request refills or for acute issues that need immediate attention.   Left arm numbness can be caused by a pinched nerve. Prednisone may help, take medication with breakfast. We may need a neck MRI if not resolved. Today we are checking B12, will let you know if it is abnormal.  Please be sure medication list is accurate. If a new problem present, please set up appointment sooner than planned today.

## 2020-05-09 ENCOUNTER — Ambulatory Visit: Payer: No Typology Code available for payment source | Admitting: Family Medicine

## 2020-05-09 ENCOUNTER — Encounter: Payer: Self-pay | Admitting: Family Medicine

## 2020-05-09 ENCOUNTER — Other Ambulatory Visit: Payer: Self-pay

## 2020-05-09 ENCOUNTER — Ambulatory Visit (INDEPENDENT_AMBULATORY_CARE_PROVIDER_SITE_OTHER): Payer: No Typology Code available for payment source

## 2020-05-09 VITALS — BP 140/82 | HR 67 | Temp 98.0°F | Resp 12 | Ht 72.0 in | Wt 186.4 lb

## 2020-05-09 DIAGNOSIS — R200 Anesthesia of skin: Secondary | ICD-10-CM | POA: Diagnosis not present

## 2020-05-09 DIAGNOSIS — M542 Cervicalgia: Secondary | ICD-10-CM | POA: Diagnosis not present

## 2020-05-09 DIAGNOSIS — H8110 Benign paroxysmal vertigo, unspecified ear: Secondary | ICD-10-CM

## 2020-05-09 DIAGNOSIS — F419 Anxiety disorder, unspecified: Secondary | ICD-10-CM

## 2020-05-09 LAB — VITAMIN B12: Vitamin B-12: 527 pg/mL (ref 211–911)

## 2020-05-09 MED ORDER — PREDNISONE 20 MG PO TABS: ORAL_TABLET | ORAL | 0 refills | Status: AC

## 2020-05-09 NOTE — Assessment & Plan Note (Addendum)
Problem is stable. Continue Valium 5 mg at bedtime as needed. [place] controled subs report reviewed, last filled 04/27/20.

## 2020-05-09 NOTE — Assessment & Plan Note (Signed)
Problem has been well controlled, no episodes for the past 6 months. Fall precautions. Continue Valium 5 mg bid prn.

## 2020-05-09 NOTE — Patient Instructions (Addendum)
A few things to remember from today's visit:   Anxiety disorder, unspecified type  Left upper extremity numbness - Plan: Vitamin B12, predniSONE (DELTASONE) 20 MG tablet  Cervicalgia - Plan: DG Cervical Spine Complete, predniSONE (DELTASONE) 20 MG tablet  If you need refills please call your pharmacy. Do not use My Chart to request refills or for acute issues that need immediate attention.   Left arm numbness can be caused by a pinched nerve. Prednisone may help, take medication with breakfast. We may need a neck MRI if not resolved. Today we are checking B12, will let you know if it is abnormal.  Please be sure medication list is accurate. If a new problem present, please set up appointment sooner than planned today.

## 2020-08-19 ENCOUNTER — Other Ambulatory Visit: Payer: Self-pay | Admitting: Family Medicine

## 2020-08-19 DIAGNOSIS — N529 Male erectile dysfunction, unspecified: Secondary | ICD-10-CM

## 2020-08-19 DIAGNOSIS — L700 Acne vulgaris: Secondary | ICD-10-CM

## 2020-09-07 ENCOUNTER — Other Ambulatory Visit: Payer: Self-pay | Admitting: Family Medicine

## 2020-09-07 DIAGNOSIS — E785 Hyperlipidemia, unspecified: Secondary | ICD-10-CM

## 2020-09-10 ENCOUNTER — Other Ambulatory Visit: Payer: Self-pay | Admitting: Family Medicine

## 2020-09-10 DIAGNOSIS — H8110 Benign paroxysmal vertigo, unspecified ear: Secondary | ICD-10-CM

## 2020-09-10 DIAGNOSIS — F419 Anxiety disorder, unspecified: Secondary | ICD-10-CM

## 2020-09-13 NOTE — Telephone Encounter (Signed)
Last filled : 07/27/20 Last OV: 05/2020 Next OV: 11/2020

## 2020-11-09 ENCOUNTER — Encounter: Payer: No Typology Code available for payment source | Admitting: Family Medicine

## 2020-11-10 ENCOUNTER — Other Ambulatory Visit: Payer: Self-pay

## 2020-11-11 ENCOUNTER — Ambulatory Visit (INDEPENDENT_AMBULATORY_CARE_PROVIDER_SITE_OTHER): Payer: No Typology Code available for payment source | Admitting: Family Medicine

## 2020-11-11 ENCOUNTER — Encounter: Payer: Self-pay | Admitting: Family Medicine

## 2020-11-11 VITALS — BP 130/80 | HR 60 | Resp 16 | Ht 72.0 in | Wt 183.4 lb

## 2020-11-11 DIAGNOSIS — Z1329 Encounter for screening for other suspected endocrine disorder: Secondary | ICD-10-CM

## 2020-11-11 DIAGNOSIS — Z13228 Encounter for screening for other metabolic disorders: Secondary | ICD-10-CM | POA: Diagnosis not present

## 2020-11-11 DIAGNOSIS — Z23 Encounter for immunization: Secondary | ICD-10-CM | POA: Diagnosis not present

## 2020-11-11 DIAGNOSIS — Z130 Encounter for screening for diseases of the blood and blood-forming organs and certain disorders involving the immune mechanism: Secondary | ICD-10-CM

## 2020-11-11 DIAGNOSIS — F419 Anxiety disorder, unspecified: Secondary | ICD-10-CM

## 2020-11-11 DIAGNOSIS — Z0000 Encounter for general adult medical examination without abnormal findings: Secondary | ICD-10-CM

## 2020-11-11 DIAGNOSIS — E785 Hyperlipidemia, unspecified: Secondary | ICD-10-CM

## 2020-11-11 DIAGNOSIS — H6123 Impacted cerumen, bilateral: Secondary | ICD-10-CM

## 2020-11-11 LAB — COMPREHENSIVE METABOLIC PANEL
ALT: 13 U/L (ref 0–53)
AST: 21 U/L (ref 0–37)
Albumin: 4.6 g/dL (ref 3.5–5.2)
Alkaline Phosphatase: 58 U/L (ref 39–117)
BUN: 20 mg/dL (ref 6–23)
CO2: 29 mEq/L (ref 19–32)
Calcium: 9.5 mg/dL (ref 8.4–10.5)
Chloride: 103 mEq/L (ref 96–112)
Creatinine, Ser: 1.14 mg/dL (ref 0.40–1.50)
GFR: 68.68 mL/min (ref >60.00)
Glucose, Bld: 89 mg/dL (ref 70–99)
Potassium: 4 mEq/L (ref 3.5–5.1)
Sodium: 140 mEq/L (ref 135–145)
Total Bilirubin: 0.9 mg/dL (ref 0.2–1.2)
Total Protein: 7 g/dL (ref 6.0–8.3)

## 2020-11-11 LAB — LIPID PANEL
Cholesterol: 183 mg/dL (ref 0–200)
HDL: 52.8 mg/dL (ref >39.00)
LDL Cholesterol: 118 mg/dL — ABNORMAL HIGH (ref 0–99)
NonHDL: 129.9
Total CHOL/HDL Ratio: 3
Triglycerides: 62 mg/dL (ref 0.0–149.0)
VLDL: 12.4 mg/dL (ref 0.0–40.0)

## 2020-11-11 LAB — HEMOGLOBIN A1C: Hgb A1c MFr Bld: 6.1 % (ref 4.6–6.5)

## 2020-11-11 MED ORDER — DEBROX 6.5 % OT SOLN: 5.0000 [drp] | Freq: Two times a day (BID) | OTIC | 0 refills | Status: AC

## 2020-11-11 NOTE — Assessment & Plan Note (Signed)
Stable. No changes in valium dose.  controled sub report reviewed,he has 3 refills available at his pharmacy.

## 2020-11-11 NOTE — Assessment & Plan Note (Signed)
Continue Simvastatin 40 mg daily and low fat diet. Further recommendations according to FLP results.

## 2020-11-11 NOTE — Patient Instructions (Addendum)
A few things to remember from today's visit:  Routine general medical examination at a health care facility  Screening for endocrine, metabolic and immunity disorder - Plan: Comprehensive metabolic panel, Hemoglobin A1c  Hyperlipidemia, unspecified hyperlipidemia type - Plan: Comprehensive metabolic panel, Lipid panel  Anxiety disorder, unspecified type  If you need refills please call your pharmacy. Do not use My Chart to request refills or for acute issues that need immediate attention.   Please be sure medication list is accurate. If a new problem present, please set up appointment sooner than planned today.  Health Maintenance, Male Adopting a healthy lifestyle and getting preventive care are important in promoting health and wellness. Ask your health care provider about: The right schedule for you to have regular tests and exams. Things you can do on your own to prevent diseases and keep yourself healthy. What should I know about diet, weight, and exercise? Eat a healthy diet  Eat a diet that includes plenty of vegetables, fruits, low-fat dairy products, and lean protein. Do not eat a lot of foods that are high in solid fats, added sugars, or sodium. Maintain a healthy weight Body mass index (BMI) is a measurement that can be used to identify possible weight problems. It estimates body fat based on height and weight. Your health care provider can help determine your BMI and help you achieve or maintain a healthy weight. Get regular exercise Get regular exercise. This is one of the most important things you can do for your health. Most adults should: Exercise for at least 150 minutes each week. The exercise should increase your heart rate and make you sweat (moderate-intensity exercise). Do strengthening exercises at least twice a week. This is in addition to the moderate-intensity exercise. Spend less time sitting. Even light physical activity can be beneficial. Watch cholesterol  and blood lipids Have your blood tested for lipids and cholesterol at 63 years of age, then have this test every 5 years. You may need to have your cholesterol levels checked more often if: Your lipid or cholesterol levels are high. You are older than 63 years of age. You are at high risk for heart disease. What should I know about cancer screening? Many types of cancers can be detected early and may often be prevented. Depending on your health history and family history, you may need to have cancer screening at various ages. This may include screening for: Colorectal cancer. Prostate cancer. Skin cancer. Lung cancer. What should I know about heart disease, diabetes, and high blood pressure? Blood pressure and heart disease High blood pressure causes heart disease and increases the risk of stroke. This is more likely to develop in people who have high blood pressure readings or are overweight. Talk with your health care provider about your target blood pressure readings. Have your blood pressure checked: Every 3-5 years if you are 21-7 years of age. Every year if you are 33 years old or older. If you are between the ages of 76 and 66 and are a current or former smoker, ask your health care provider if you should have a one-time screening for abdominal aortic aneurysm (AAA). Diabetes Have regular diabetes screenings. This checks your fasting blood sugar level. Have the screening done: Once every three years after age 60 if you are at a normal weight and have a low risk for diabetes. More often and at a younger age if you are overweight or have a high risk for diabetes. What should I know about  preventing infection? Hepatitis B If you have a higher risk for hepatitis B, you should be screened for this virus. Talk with your health care provider to find out if you are at risk for hepatitis B infection. Hepatitis C Blood testing is recommended for: Everyone born from [birth year] through  1963. Anyone with known risk factors for hepatitis C. Sexually transmitted infections (STIs) You should be screened each year for STIs, including gonorrhea and chlamydia, if: You are sexually active and are younger than 62 years of age. You are older than 63 years of age and your health care provider tells you that you are at risk for this type of infection. Your sexual activity has changed since you were last screened, and you are at increased risk for chlamydia or gonorrhea. Ask your health care provider if you are at risk. Ask your health care provider about whether you are at high risk for HIV. Your health care provider may recommend a prescription medicine to help prevent HIV infection. If you choose to take medicine to prevent HIV, you should first get tested for HIV. You should then be tested every 3 months for as long as you are taking the medicine. Follow these instructions at home: Alcohol use Do not drink alcohol if your health care provider tells you not to drink. If you drink alcohol: Limit how much you have to 0-2 drinks a day. Know how much alcohol is in your drink. In the [place] one drink equals one 12 oz bottle of beer (355 mL), one 5 oz glass of wine (148 mL), or one 1 oz glass of hard liquor (44 mL). Lifestyle Do not use any products that contain nicotine or tobacco. These products include cigarettes, chewing tobacco, and vaping devices, such as e-cigarettes. If you need help quitting, ask your health care provider. Do not use street drugs. Do not share needles. Ask your health care provider for help if you need support or information about quitting drugs. General instructions Schedule regular health, dental, and eye exams. Stay current with your vaccines. Tell your health care provider if: You often feel depressed. You have ever been abused or do not feel safe at home. Summary Adopting a healthy lifestyle and getting preventive care are important in promoting health and  wellness. Follow your health care provider's instructions about healthy diet, exercising, and getting tested or screened for diseases. Follow your health care provider's instructions on monitoring your cholesterol and blood pressure. This information is not intended to replace advice given to you by your health care provider. Make sure you discuss any questions you have with your health care provider. Document Revised: 05/16/2020 Document Reviewed: 05/16/2020 Elsevier Patient Education  2022 [employer].

## 2020-11-11 NOTE — Progress Notes (Signed)
HPI: [name] is a 65 y.o.male here today for his routine physical examination.  Last CPE: 11/10/19 He lives with his wife.  Regular exercise 3 or more times per week: Going to the gym 2-3 times per week and walking almost daily. Following a healthful diet: Most of the time home made meals.   Chronic medical problems: anxiety,vertigo,HLD.  Immunization History  Administered Date(s) Administered   Influenza,inj,Quad PF,6+ Mos 12/28/2016, 12/23/2017, 10/16/2018, 11/10/2019, 11/11/2020   PFIZER Comirnaty(Gray Top)Covid-19 Tri-Sucrose Vaccine 11/30/2019, 09/27/2020   PFIZER(Purple Top)SARS-COV-2 Vaccination 04/10/2019, 05/05/2019   Zoster Recombinat (Shingrix) 06/06/2018, 08/08/2018   09/2020 2nd COVID 19 booster.  -Hep C screening: 07/01/17 NR.   Last colon cancer screening: 11/20/17 Last prostate ca screening: He follows with urologist due to elevated PSA.   Last follow up 06/2020, PSA was 2.8.   Negative for tobacco use. He does not drink alcohol frequently, "wine rarely."   -Concerns and/or follow up today:  Hyperlipidemia: Currently he is on simvastatin 40 mg daily. Tolerating medication well.     Component Value Date/Time   CHOL 192 11/10/2019 0935   TRIG 101 11/10/2019 0935   HDL 51 11/10/2019 0935   CHOLHDL 3.8 11/10/2019 0935   VLDL 20.2 12/08/2018 0816   LDLCALC 121 (H) 11/10/2019 0935      Component Value Date/Time   PROT 6.9 11/10/2019 0935   ALBUMIN 4.0 11/17/2018 1212   AST 19 11/10/2019 0935   ALT 18 11/10/2019 0935   ALKPHOS 81 11/17/2018 1212   BILITOT 0.6 11/10/2019 0935   Vertigo and anxiety: He is on Valium 5 mg bid. Medication is still helping and no side effects.  Review of Systems  Constitutional:  Negative for activity change, appetite change, fatigue and fever.  HENT:  Negative for mouth sores, nosebleeds, sore throat and trouble swallowing.   Eyes:  Negative for redness and visual disturbance.  Respiratory:  Negative for cough,  shortness of breath and wheezing.   Cardiovascular:  Negative for chest pain, palpitations and leg swelling.  Gastrointestinal:  Negative for abdominal pain, blood in stool, nausea and vomiting.  Endocrine: Negative for polydipsia and polyuria.  Genitourinary:  Negative for decreased urine volume, dysuria, genital sores, hematuria and testicular pain.  Musculoskeletal:  Negative for gait problem and myalgias.  Skin:  Negative for color change and rash.  Neurological:  Negative for dizziness, seizures, syncope, weakness, numbness and headaches.  Hematological:  Negative for adenopathy. Does not bruise/bleed easily.  Psychiatric/Behavioral:  Negative for confusion and sleep disturbance. The patient is not nervous/anxious.   All other systems reviewed and are negative.  Current Outpatient Medications on File Prior to Visit  Medication Sig Dispense Refill   Cholecalciferol (VITAMIN D) 2000 units CAPS Take 1 capsule by mouth daily.     clindamycin-benzoyl peroxide (BENZACLIN) gel APPLY TOPICALLY TO THE AFFECTED AREA AS NEEDED 100 g 2   diazepam (VALIUM) 5 MG tablet TAKE 1 TABLET BY MOUTH EVERY 12 HOURS AS NEEDED FOR ANXIETY. NOT TO EXCEED 40 TABLETS PER MONTH 40 tablet 3   fluticasone (FLONASE) 50 MCG/ACT nasal spray Place 1 spray into both nostrils daily.     Omega-3 Fatty Acids (FISH OIL TRIPLE STRENGTH) 1400 MG CAPS Take 1 capsule by mouth daily.     sildenafil (VIAGRA) 100 MG tablet TAKE 1 TABLET BY MOUTH DAILY AS NEEDED FOR ERECTILE DYSFUNCTION 18 tablet 3   simvastatin (ZOCOR) 40 MG tablet TAKE 1 TABLET(40 MG) BY MOUTH AT BEDTIME 90 tablet 3  No current facility-administered medications on file prior to visit.   Past Medical History:  Diagnosis Date   Allergy    Anxiety    Cataract    early cataracts    Hyperlipidemia    Vertigo, benign positional     Past Surgical History:  Procedure Laterality Date   COLONOSCOPY     last colon 2014- normal    No Known Allergies  Family  History  Problem Relation Age of Onset   Cancer Mother    Breast cancer Mother    Colon cancer Sister 53       died age 43   Colon polyps Neg Hx    Esophageal cancer Neg Hx    Rectal cancer Neg Hx    Stomach cancer Neg Hx     Social History   Socioeconomic History   Marital status: Married    Spouse name: Not on file   Number of children: Not on file   Years of education: Not on file   Highest education level: Not on file  Occupational History   Not on file  Tobacco Use   Smoking status: Never   Smokeless tobacco: Never  Vaping Use   Vaping Use: Never used  Substance and Sexual Activity   Alcohol use: Yes    Comment: occasionally    Drug use: No   Sexual activity: Not on file  Other Topics Concern   Not on file  Social History Narrative   Not on file   Social Determinants of Health   Financial Resource Strain: Not on file  Food Insecurity: Not on file  Transportation Needs: Not on file  Physical Activity: Not on file  Stress: Not on file  Social Connections: Not on file   Vitals:   11/11/20 0855  BP: 130/80  Pulse: 60  Resp: 16  SpO2: 98%   Body mass index is 24.87 kg/m.  Wt Readings from Last 3 Encounters:  11/11/20 183 lb 6 oz (83.2 kg)  05/09/20 186 lb 6.4 oz (84.6 kg)  11/10/19 189 lb 4 oz (85.8 kg)   Physical Exam Vitals and nursing note reviewed.  Constitutional:      General: He is not in acute distress.    Appearance: He is well-developed, well-groomed and normal weight.  HENT:     Head: Normocephalic and atraumatic.     Right Ear: External ear normal. Tympanic membrane is not erythematous.     Left Ear: External ear normal.     Ears:     Comments: Cerumen excess closed to TM, I could not see left TM. Right TM seen partially.    Mouth/Throat:     Mouth: Mucous membranes are moist.     Pharynx: Oropharynx is clear.  Eyes:     Extraocular Movements: Extraocular movements intact.     Conjunctiva/sclera: Conjunctivae normal.      Pupils: Pupils are equal, round, and reactive to light.  Neck:     Thyroid: No thyromegaly.     Trachea: No tracheal deviation.  Cardiovascular:     Rate and Rhythm: Normal rate and regular rhythm.     Pulses:          Dorsalis pedis pulses are 2+ on the right side and 2+ on the left side.     Heart sounds: No murmur heard. Pulmonary:     Effort: Pulmonary effort is normal. No respiratory distress.     Breath sounds: Normal breath sounds.  Abdominal:  Palpations: Abdomen is soft. There is no hepatomegaly or mass.     Tenderness: There is no abdominal tenderness.  Genitourinary:    Comments: No concerns. Musculoskeletal:        General: No tenderness.     Cervical back: Normal range of motion.     Comments: No major deformities appreciated and no signs of synovitis.  Lymphadenopathy:     Cervical: No cervical adenopathy.     Upper Body:     Right upper body: No supraclavicular adenopathy.     Left upper body: No supraclavicular adenopathy.  Skin:    General: Skin is warm.     Findings: No erythema.  Neurological:     General: No focal deficit present.     Mental Status: He is alert and oriented to person, place, and time.     Cranial Nerves: No cranial nerve deficit.     Sensory: No sensory deficit.     Gait: Gait normal.     Deep Tendon Reflexes:     Reflex Scores:      Bicep reflexes are 2+ on the right side and 2+ on the left side.      Patellar reflexes are 2+ on the right side and 2+ on the left side. Psychiatric:        Mood and Affect: Mood and affect normal.   ASSESSMENT AND PLAN:  Mr.Kohen was seen today for annual exam.  Diagnoses and all orders for this visit: Orders Placed This Encounter  Procedures   Flu Vaccine QUAD 70mo+IM (Fluarix, Fluzone & Alfiuria Quad PF)   Comprehensive metabolic panel   Lipid panel   Hemoglobin A1c   Lab Results  Component Value Date   HGBA1C 6.1 11/11/2020   Lab Results  Component Value Date   CREATININE 1.14  11/11/2020   BUN 20 11/11/2020   NA 140 11/11/2020   K 4.0 11/11/2020   CL 103 11/11/2020   CO2 29 11/11/2020   Lab Results  Component Value Date   ALT 13 11/11/2020   AST 21 11/11/2020   ALKPHOS 58 11/11/2020   BILITOT 0.9 11/11/2020   Lab Results  Component Value Date   CHOL 183 11/11/2020   HDL 52.80 11/11/2020   LDLCALC 118 (H) 11/11/2020   TRIG 62.0 11/11/2020   CHOLHDL 3 11/11/2020   Routine general medical examination at a health care facility We discussed the importance of regular physical activity and healthy diet for prevention of chronic illness and/or complications. Preventive guidelines reviewed. Vaccination up to date. Next CPE in a year. The 10-year ASCVD risk score (Arnett DK, et al., 2019) is: 9.1%   Values used to calculate the score:     Age: 72 years     Sex: Male     Is Non-Hispanic African American: Yes     Diabetic: No     Tobacco smoker: No     Systolic Blood Pressure: [id number]     Is BP treated: No     HDL Cholesterol: 52.8 mg/dL     Total Cholesterol: 183 mg/dL  Screening for endocrine, metabolic and immunity disorder -     Hemoglobin A1c -     Comprehensive metabolic panel  Excessive cerumen in both ear canals Avoid Q tips. Debrox a few times per week may help.  -     carbamide peroxide (DEBROX) 6.5 % OTIC solution; Place 5 drops into the left ear 2 (two) times daily.  Need for influenza vaccination -  Flu Vaccine QUAD 69mo+IM (Fluarix, Fluzone & Alfiuria Quad PF)  Hyperlipidemia Continue Simvastatin 40 mg daily and low fat diet. Further recommendations according to FLP results.  Anxiety disorder, unspecified Stable. No changes in valium dose. [place] controled sub report reviewed,he has 3 refills available at his pharmacy.  Return in 6 months (on 05/11/2021).   Michal Strzelecki G. [place], MD  [facility]. [place] office.

## 2021-01-17 ENCOUNTER — Other Ambulatory Visit: Payer: Self-pay | Admitting: Family Medicine

## 2021-01-17 DIAGNOSIS — H8110 Benign paroxysmal vertigo, unspecified ear: Secondary | ICD-10-CM

## 2021-01-17 DIAGNOSIS — F419 Anxiety disorder, unspecified: Secondary | ICD-10-CM

## 2021-05-10 NOTE — Progress Notes (Signed)
? ?HPI: ?Mr.CORNELLIUS KROPP is a 66 y.o. male, who is here today for follow up. ?He was last seen on 11/11/20. ?No new problems since his last visit. ?He has some questions about COVID 19 booster. ? ?Anxiety and vertigo: He is on Valium 5 mg, which he takes daily as needed and still helps. ?He has episodes every few months. Spinning like sensation.It lasted a few minutes. ?Exacerbated by lying down/head movement. ?Episode of vertigo last week, lasted a few days. Thought it was due to fluid in left ear. ?No earache or drainage. ?No changes in hearing. ?No new associated symptoms. ? ?He is still wearing his mask when going out, gym mainly. He does not go to restaurants, pick up instead. He tried to wash his hands frequently. ?His last COVID 19 vaccine, 09/2021. He has had 5 boosters total. ? ?Review of Systems  ?Constitutional:  Negative for activity change, appetite change and fever.  ?HENT:  Negative for nosebleeds and sore throat.   ?Eyes:  Negative for redness and visual disturbance.  ?Respiratory:  Negative for cough, shortness of breath and wheezing.   ?Cardiovascular:  Negative for chest pain, palpitations and leg swelling.  ?Gastrointestinal:  Negative for abdominal pain, nausea and vomiting.  ?Neurological:  Negative for syncope, weakness and headaches.  ?Psychiatric/Behavioral:  Negative for confusion. The patient is nervous/anxious.   ?Rest see pertinent positives and negatives per HPI. ? ?Current Outpatient Medications on File Prior to Visit  ?Medication Sig Dispense Refill  ? carbamide peroxide (DEBROX) 6.5 % OTIC solution Place 5 drops into the left ear 2 (two) times daily. 15 mL 0  ? Cholecalciferol (VITAMIN D) 2000 units CAPS Take 1 capsule by mouth daily.    ? clindamycin-benzoyl peroxide (BENZACLIN) gel APPLY TOPICALLY TO THE AFFECTED AREA AS NEEDED 100 g 2  ? fluticasone (FLONASE) 50 MCG/ACT nasal spray Place 1 spray into both nostrils daily.    ? Omega-3 Fatty Acids (FISH OIL TRIPLE STRENGTH) 1400 MG  CAPS Take 1 capsule by mouth daily.    ? sildenafil (VIAGRA) 100 MG tablet TAKE 1 TABLET BY MOUTH DAILY AS NEEDED FOR ERECTILE DYSFUNCTION 18 tablet 3  ? simvastatin (ZOCOR) 40 MG tablet TAKE 1 TABLET(40 MG) BY MOUTH AT BEDTIME 90 tablet 3  ? ?No current facility-administered medications on file prior to visit.  ? ?Past Medical History:  ?Diagnosis Date  ? Allergy   ? Anxiety   ? Cataract   ? early cataracts   ? Hyperlipidemia   ? Vertigo, benign positional   ? ?No Known Allergies ? ?Social History  ? ?Socioeconomic History  ? Marital status: Married  ?  Spouse name: Not on file  ? Number of children: Not on file  ? Years of education: Not on file  ? Highest education level: Not on file  ?Occupational History  ? Not on file  ?Tobacco Use  ? Smoking status: Never  ? Smokeless tobacco: Never  ?Vaping Use  ? Vaping Use: Never used  ?Substance and Sexual Activity  ? Alcohol use: Yes  ?  Comment: occasionally   ? Drug use: No  ? Sexual activity: Not on file  ?Other Topics Concern  ? Not on file  ?Social History Narrative  ? Not on file  ? ?Social Determinants of Health  ? ?Financial Resource Strain: Unknown  ? Difficulty of Paying Living Expenses: Patient refused  ?Food Insecurity: No Food Insecurity  ? Worried About Programme researcher, broadcasting/film/video in the Last Year: Never true  ?  Ran Out of Food in the Last Year: Never true  ?Transportation Needs: No Transportation Needs  ? Lack of Transportation (Medical): No  ? Lack of Transportation (Non-Medical): No  ?Physical Activity: Sufficiently Active  ? Days of Exercise per Week: 4 days  ? Minutes of Exercise per Session: 90 min  ?Stress: Not on file  ?Social Connections: Unknown  ? Frequency of Communication with Friends and Family: Patient refused  ? Frequency of Social Gatherings with Friends and Family: Patient refused  ? Attends Religious Services: Patient refused  ? Active Member of Clubs or Organizations: Patient refused  ? Attends Banker Meetings: Not on file  ?  Marital Status: Married  ? ?Vitals:  ? 05/12/21 0901  ?BP: 130/80  ?Pulse: 61  ?Resp: 16  ?Temp: 97.8 ?F (36.6 ?C)  ?SpO2: 98%  ? ?Body mass index is 25.51 kg/m?. ? ?Physical Exam ?Vitals and nursing note reviewed.  ?Constitutional:   ?   General: He is not in acute distress. ?   Appearance: He is well-developed.  ?HENT:  ?   Head: Normocephalic and atraumatic.  ?   Right Ear: Ear canal and external ear normal. Tympanic membrane is scarred.  ?   Left Ear: Tympanic membrane, ear canal and external ear normal.  ?Eyes:  ?   Conjunctiva/sclera: Conjunctivae normal.  ?Cardiovascular:  ?   Rate and Rhythm: Normal rate and regular rhythm.  ?   Heart sounds: No murmur heard. ?Pulmonary:  ?   Effort: Pulmonary effort is normal. No respiratory distress.  ?   Breath sounds: Normal breath sounds.  ?Abdominal:  ?   Palpations: Abdomen is soft. There is no hepatomegaly or mass.  ?   Tenderness: There is no abdominal tenderness.  ?Skin: ?   General: Skin is warm.  ?   Findings: No erythema.  ?Neurological:  ?   Mental Status: He is alert and oriented to person, place, and time.  ?   Gait: Gait normal.  ?Psychiatric:     ?   Mood and Affect: Affect normal. Mood is anxious.     ?   Thought Content: Thought content does not include suicidal ideation.  ? ?ASSESSMENT AND PLAN: ? ?Mr. Tiron was seen today for follow-up. ? ?Diagnoses and all orders for this visit: ? ?Benign paroxysmal positional vertigo, unspecified laterality ?Chronic and overall stable. ?No changes in Valium dose. ?Fall precautions. ? ?-     diazepam (VALIUM) 5 MG tablet; TAKE 1 TABLET BY MOUTH EVERY 12 HOURS AS NEEDED FOR ANXIETY. NOT TO EXCEED 40 TABLETS PER MONTH ? ?Anxiety disorder, unspecified type ?Stable. ?Continue Valium 5 mg daily bid prn. ?Some side effect discussed. ? ?-     diazepam (VALIUM) 5 MG tablet; TAKE 1 TABLET BY MOUTH EVERY 12 HOURS AS NEEDED FOR ANXIETY. NOT TO EXCEED 40 TABLETS PER MONTH ? ?Educated about COVID-19 virus infection ?Current  recommendations in regard to mask wearing and vaccination discussed. ? ?Return in about 27 weeks (around 11/17/2021) for cpe. ? ?Sohil Timko G. [place], MD ? ?[facility]. ?[place] office. ? ?

## 2021-05-12 ENCOUNTER — Encounter: Payer: Self-pay | Admitting: Family Medicine

## 2021-05-12 ENCOUNTER — Telehealth: Payer: Self-pay | Admitting: Family Medicine

## 2021-05-12 ENCOUNTER — Ambulatory Visit: Payer: No Typology Code available for payment source | Admitting: Family Medicine

## 2021-05-12 VITALS — BP 130/80 | HR 61 | Temp 97.8°F | Resp 16 | Ht 72.0 in | Wt 188.1 lb

## 2021-05-12 DIAGNOSIS — F419 Anxiety disorder, unspecified: Secondary | ICD-10-CM | POA: Diagnosis not present

## 2021-05-12 DIAGNOSIS — H8110 Benign paroxysmal vertigo, unspecified ear: Secondary | ICD-10-CM | POA: Diagnosis not present

## 2021-05-12 DIAGNOSIS — Z7189 Other specified counseling: Secondary | ICD-10-CM

## 2021-05-12 MED ORDER — DIAZEPAM 5 MG PO TABS: ORAL_TABLET | ORAL | 3 refills | Status: DC

## 2021-05-12 NOTE — Telephone Encounter (Signed)
Dr. Martinique re-sent Rx with directions. ?

## 2021-05-12 NOTE — Patient Instructions (Signed)
A few things to remember from today's visit: ? ?Anxiety disorder, unspecified type - Plan: diazepam (VALIUM) 5 MG tablet ? ?Benign paroxysmal positional vertigo, unspecified laterality - Plan: diazepam (VALIUM) 5 MG tablet ? ?If you need refills please call your pharmacy. ?Do not use My Chart to request refills or for acute issues that need immediate attention. ?  ?No changes today. ? ?Please be sure medication list is accurate. ?If a new problem present, please set up appointment sooner than planned today. ? ? ? ? ? ? ? ?

## 2021-05-12 NOTE — Telephone Encounter (Signed)
Franklin Baker with [facility] is calling and needs direction for diazepam ?

## 2021-08-22 ENCOUNTER — Other Ambulatory Visit: Payer: Self-pay | Admitting: Family Medicine

## 2021-08-22 DIAGNOSIS — N529 Male erectile dysfunction, unspecified: Secondary | ICD-10-CM

## 2021-09-18 ENCOUNTER — Other Ambulatory Visit: Payer: Self-pay | Admitting: Family Medicine

## 2021-09-18 DIAGNOSIS — F419 Anxiety disorder, unspecified: Secondary | ICD-10-CM

## 2021-09-18 DIAGNOSIS — H8110 Benign paroxysmal vertigo, unspecified ear: Secondary | ICD-10-CM

## 2021-11-15 NOTE — Progress Notes (Unsigned)
HPI: [name] is a 66 y.o.male with a history of vertigo, anxiety, ED,hyperlipidemia, and prediabetes here today for his routine physical examination.  Last CPE: 11/11/20  He reports exercising regularly, attending the gym three to four times per week, and mostly adhering to a healthful diet with almost daily vegetables.  He sleeps for approximately nine hours per night.  Immunization History  Administered Date(s) Administered   Influenza,inj,Quad PF,6+ Mos 12/28/2016, 12/23/2017, 10/16/2018, 11/10/2019, 11/11/2020   Influenza-Unspecified 09/29/2021   PFIZER Comirnaty(Gray Top)Covid-19 Tri-Sucrose Vaccine 11/30/2019, 09/27/2020, 09/29/2021   PFIZER(Purple Top)SARS-COV-2 Vaccination 04/10/2019, 05/05/2019   Tdap 11/17/2021   Zoster Recombinat (Shingrix) 06/06/2018, 08/08/2018    Health Maintenance  Topic Date Due   COVID-19 Vaccine (6 - Pfizer series) 11/24/2021   COLONOSCOPY (Pts 45-16yrs Insurance coverage will need to be confirmed)  11/21/2022   TETANUS/TDAP  11/18/2031   INFLUENZA VACCINE  Completed   Hepatitis C Screening  Completed   HIV Screening  Completed   Zoster Vaccines- Shingrix  Completed   HPV VACCINES  Aged Out   His last colonoscopy was on November 20, 2017, and he believes he is due for another one in five years. He follows with urologist due to elevated PSA.    -Negative for high alcohol intake or tobacco use.  -Concerns and/or follow up today: *** Hyperlipidemia on simvastatin 40 mg daily. He has tolerated medication well, no side effects.  Lab Results  Component Value Date   CHOL 183 11/11/2020   HDL 52.80 11/11/2020   LDLCALC 118 (H) 11/11/2020   TRIG 62.0 11/11/2020   CHOLHDL 3 11/11/2020  Prediabetes: Negative for polyuria, polydipsia, polyphagia.  Lab Results  Component Value Date   HGBA1C 6.1 11/11/2020   He has been experiencing increased episodes of "vertigo", intermittently for the past 2 months, lasting for three to four hours  at a time, and sometimes persisting into the next day. He describes the episodes as more frequent but not as severe as the spinning sensation he used to have.  These episodes are different to his vertigo. He denies any associated chest pain, dyspnea, diaphoresis, or palpitations during these episodes. The dizziness primarily occurs in the morning, noted when he first wakes up.  It is alleviated by rest, he has not identified exacerbating factors, he tries to avoid laying on his back because this has triggered vertigo in the past. He has not noticed any changes in his hearing or tinnitus. He is on diltiazem 5 mg twice daily for vertigo and anxiety.  Review of Systems  Constitutional:  Negative for activity change, appetite change and fever.  HENT:  Negative for mouth sores, nosebleeds, sore throat and trouble swallowing.   Eyes:  Negative for redness and visual disturbance.  Respiratory:  Negative for cough, shortness of breath and wheezing.   Cardiovascular:  Negative for chest pain, palpitations and leg swelling.  Gastrointestinal:  Negative for abdominal pain, blood in stool, nausea and vomiting.  Endocrine: Negative for cold intolerance and heat intolerance.  Genitourinary:  Negative for decreased urine volume, dysuria, genital sores, hematuria and testicular pain.  Musculoskeletal:  Negative for gait problem and myalgias.  Skin:  Negative for color change and rash.  Allergic/Immunologic: Positive for environmental allergies.  Neurological:  Positive for dizziness. Negative for seizures, syncope, weakness, numbness and headaches.  Hematological:  Negative for adenopathy. Does not bruise/bleed easily.  Psychiatric/Behavioral:  Negative for confusion and sleep disturbance. The patient is nervous/anxious.    Current Outpatient Medications on File  Prior to Visit  Medication Sig Dispense Refill   carbamide peroxide (DEBROX) 6.5 % OTIC solution Place 5 drops into the left ear 2 (two) times daily.  15 mL 0   Cholecalciferol (VITAMIN D) 2000 units CAPS Take 1 capsule by mouth daily.     clindamycin-benzoyl peroxide (BENZACLIN) gel APPLY TOPICALLY TO THE AFFECTED AREA AS NEEDED 100 g 2   diazepam (VALIUM) 5 MG tablet TAKE 1 TABLET BY MOUTH EVERY 12 HOURS AS NEEDED FOR ANXIETY. NOT TO EXCEED 40 TABLETS PER MONTH 40 tablet 3   fluticasone (FLONASE) 50 MCG/ACT nasal spray Place 1 spray into both nostrils daily.     Omega-3 Fatty Acids (FISH OIL TRIPLE STRENGTH) 1400 MG CAPS Take 1 capsule by mouth daily.     sildenafil (VIAGRA) 100 MG tablet TAKE 1 TABLET BY MOUTH DAILY AS NEEDED FOR ERECTILE DYSFUNCTION 18 tablet 0   simvastatin (ZOCOR) 40 MG tablet TAKE 1 TABLET(40 MG) BY MOUTH AT BEDTIME 90 tablet 3   No current facility-administered medications on file prior to visit.   Past Medical History:  Diagnosis Date   Allergy    Anxiety    Cataract    early cataracts    Hyperlipidemia    Vertigo, benign positional    Past Surgical History:  Procedure Laterality Date   COLONOSCOPY     last colon 2014- normal   No Known Allergies  Family History  Problem Relation Age of Onset   Cancer Mother    Breast cancer Mother    Colon cancer Sister 70       died age 40   Colon polyps Neg Hx    Esophageal cancer Neg Hx    Rectal cancer Neg Hx    Stomach cancer Neg Hx     Social History   Socioeconomic History   Marital status: Married    Spouse name: Not on file   Number of children: Not on file   Years of education: Not on file   Highest education level: Not on file  Occupational History   Not on file  Tobacco Use   Smoking status: Never   Smokeless tobacco: Never  Vaping Use   Vaping Use: Never used  Substance and Sexual Activity   Alcohol use: Yes    Comment: occasionally    Drug use: No   Sexual activity: Not on file  Other Topics Concern   Not on file  Social History Narrative   Not on file   Social Determinants of Health   Financial Resource Strain: Unknown  (05/08/2021)   Overall Financial Resource Strain (CARDIA)    Difficulty of Paying Living Expenses: Patient refused  Food Insecurity: No Food Insecurity (05/08/2021)   Hunger Vital Sign    Worried About Running Out of Food in the Last Year: Never true    Ran Out of Food in the Last Year: Never true  Transportation Needs: No Transportation Needs (05/08/2021)   PRAPARE - Hydrologist (Medical): No    Lack of Transportation (Non-Medical): No  Physical Activity: Sufficiently Active (05/08/2021)   Exercise Vital Sign    Days of Exercise per Week: 4 days    Minutes of Exercise per Session: 90 min  Stress: Not on file  Social Connections: Unknown (05/08/2021)   Social Connection and Isolation Panel [NHANES]    Frequency of Communication with Friends and Family: Patient refused    Frequency of Social Gatherings with Friends and Family: Patient refused  Attends Religious Services: Patient refused    Active Member of Clubs or Organizations: Patient refused    Attends Archivist Meetings: Not on file    Marital Status: Married     Vitals:   11/17/21 0848  BP: 126/70  Pulse: 60  Resp: 12  Temp: 98.1 F (36.7 C)  SpO2: 98%  Body mass index is 26.06 kg/m. Wt Readings from Last 3 Encounters:  11/17/21 192 lb 2 oz (87.1 kg)  05/12/21 188 lb 2 oz (85.3 kg)  11/11/20 183 lb 6 oz (83.2 kg)  Physical Exam Vitals and nursing note reviewed.  Constitutional:      General: He is not in acute distress.    Appearance: He is well-developed.  HENT:     Head: Normocephalic and atraumatic.     Right Ear: Tympanic membrane, ear canal and external ear normal.     Left Ear: Tympanic membrane, ear canal and external ear normal.     Mouth/Throat:     Mouth: Mucous membranes are moist.     Pharynx: Oropharynx is clear.  Eyes:     Extraocular Movements: Extraocular movements intact.     Conjunctiva/sclera: Conjunctivae normal.     Pupils: Pupils are equal, round, and  reactive to light.  Neck:     Thyroid: No thyroid mass.     Vascular: No carotid bruit.  Cardiovascular:     Rate and Rhythm: Normal rate and regular rhythm.     Pulses:          Dorsalis pedis pulses are 2+ on the right side and 2+ on the left side.     Heart sounds: No murmur heard. Pulmonary:     Effort: Pulmonary effort is normal. No respiratory distress.     Breath sounds: Normal breath sounds.  Abdominal:     Palpations: Abdomen is soft. There is no hepatomegaly or mass.     Tenderness: There is no abdominal tenderness.  Genitourinary:    Comments: No concerns. Musculoskeletal:        General: No tenderness.     Cervical back: Normal range of motion.     Comments: No major deformities appreciated and no signs of synovitis.  Lymphadenopathy:     Cervical: No cervical adenopathy.     Upper Body:     Right upper body: No supraclavicular adenopathy.     Left upper body: No supraclavicular adenopathy.  Skin:    General: Skin is warm.     Findings: No erythema.  Neurological:     General: No focal deficit present.     Mental Status: He is alert and oriented to person, place, and time.     Cranial Nerves: No cranial nerve deficit.     Sensory: No sensory deficit.     Gait: Gait normal.     Deep Tendon Reflexes:     Reflex Scores:      Bicep reflexes are 2+ on the right side and 2+ on the left side.      Patellar reflexes are 2+ on the right side and 2+ on the left side. Psychiatric:        Mood and Affect: Mood and affect normal.   ASSESSMENT AND PLAN:  [name] was seen today for annual exam.  Diagnoses and all orders for this visit: Orders Placed This Encounter  Procedures   Tdap vaccine greater than or equal to 7yo IM   CBC   Lipid panel   Comprehensive metabolic panel  TSH   Hemoglobin A1c   Routine general medical examination at a health care facility We discussed the importance of regular physical activity and healthy diet for prevention of chronic  illness and/or complications. Preventive guidelines reviewed. Next CPE in a year. He has received his flu shot,he has already had two shingles vaccines, and will be due for Tdap 01/2022; he agrees with Tdap today.  Dizziness *** We discussed possible etiologies, history and exam today do not suggest a serious process. Continue monitoring for new symptoms. Fall prevention discussed. Further recommendation will be given according to lab results.  Need for Tdap vaccination -     Tdap vaccine greater than or equal to 7yo IM   Orders Placed This Encounter  Procedures   Tdap vaccine greater than or equal to 7yo IM   CBC   Lipid panel   Comprehensive metabolic panel   TSH   Hemoglobin A1c   Hyperlipidemia Continue Simvastatin 40 mg daily and low fat diet. FLP ordered today, further recommendation will be given according to results.  Anxiety disorder, unspecified Problem is otherwise stable. Continue diltiazem 5 mg twice daily as needed. Follow-up in 6 months, before if needed.  Prediabetes Continue a healthy lifestyle for diabetes prevention. Further recommendation will be given according to hemoglobin A1c done today.  Return in 6 months (on 05/18/2022) for follow up.  Franklin Baker G. [place], MD  [facility]. [place] [facility].

## 2021-11-17 ENCOUNTER — Ambulatory Visit (INDEPENDENT_AMBULATORY_CARE_PROVIDER_SITE_OTHER): Payer: No Typology Code available for payment source | Admitting: Family Medicine

## 2021-11-17 ENCOUNTER — Encounter: Payer: Self-pay | Admitting: Family Medicine

## 2021-11-17 VITALS — BP 126/70 | HR 60 | Temp 98.1°F | Resp 12 | Ht 72.0 in | Wt 192.1 lb

## 2021-11-17 DIAGNOSIS — R7303 Prediabetes: Secondary | ICD-10-CM

## 2021-11-17 DIAGNOSIS — Z0000 Encounter for general adult medical examination without abnormal findings: Secondary | ICD-10-CM

## 2021-11-17 DIAGNOSIS — R42 Dizziness and giddiness: Secondary | ICD-10-CM | POA: Diagnosis not present

## 2021-11-17 DIAGNOSIS — E785 Hyperlipidemia, unspecified: Secondary | ICD-10-CM

## 2021-11-17 DIAGNOSIS — Z13228 Encounter for screening for other metabolic disorders: Secondary | ICD-10-CM

## 2021-11-17 DIAGNOSIS — Z23 Encounter for immunization: Secondary | ICD-10-CM

## 2021-11-17 DIAGNOSIS — F419 Anxiety disorder, unspecified: Secondary | ICD-10-CM | POA: Diagnosis not present

## 2021-11-17 LAB — LIPID PANEL
Cholesterol: 177 mg/dL (ref 0–200)
HDL: 42.2 mg/dL (ref >39.00)
LDL Cholesterol: 104 mg/dL — ABNORMAL HIGH (ref 0–99)
NonHDL: 134.34
Total CHOL/HDL Ratio: 4
Triglycerides: 151 mg/dL — ABNORMAL HIGH (ref 0.0–149.0)
VLDL: 30.2 mg/dL (ref 0.0–40.0)

## 2021-11-17 LAB — COMPREHENSIVE METABOLIC PANEL
ALT: 18 U/L (ref 0–53)
AST: 18 U/L (ref 0–37)
Albumin: 4.5 g/dL (ref 3.5–5.2)
Alkaline Phosphatase: 64 U/L (ref 39–117)
BUN: 14 mg/dL (ref 6–23)
CO2: 31 mEq/L (ref 19–32)
Calcium: 9.3 mg/dL (ref 8.4–10.5)
Chloride: 104 mEq/L (ref 96–112)
Creatinine, Ser: 1.12 mg/dL (ref 0.40–1.50)
GFR: 69.66 mL/min (ref >60.00)
Glucose, Bld: 94 mg/dL (ref 70–99)
Potassium: 3.7 mEq/L (ref 3.5–5.1)
Sodium: 139 mEq/L (ref 135–145)
Total Bilirubin: 0.7 mg/dL (ref 0.2–1.2)
Total Protein: 7.2 g/dL (ref 6.0–8.3)

## 2021-11-17 LAB — CBC
HCT: 41.7 % (ref 39.0–52.0)
Hemoglobin: 13.9 g/dL (ref 13.0–17.0)
MCHC: 33.4 g/dL (ref 30.0–36.0)
MCV: 86.3 fl (ref 78.0–100.0)
Platelets: 265 10*3/uL (ref 150.0–400.0)
RBC: 4.83 Mil/uL (ref 4.22–5.81)
RDW: 13.7 % (ref 11.5–15.5)
WBC: 4.9 10*3/uL (ref 4.0–10.5)

## 2021-11-17 LAB — HEMOGLOBIN A1C: Hgb A1c MFr Bld: 6.3 % (ref 4.6–6.5)

## 2021-11-17 LAB — TSH: TSH: 1.02 u[IU]/mL (ref 0.35–5.50)

## 2021-11-17 NOTE — Patient Instructions (Addendum)
A few things to remember from today's visit:  Routine general medical examination at a health care facility  Screening for endocrine, metabolic and immunity disorder  Hyperlipidemia, unspecified hyperlipidemia type  Dizziness  If you need refills for medications you take chronically, please call your pharmacy. Do not use My Chart to request refills or for acute issues that need immediate attention. If you send a my chart message, it may take a few days to be addressed, specially if I am not in the office.  Please be sure medication list is accurate. If a new problem present, please set up appointment sooner than planned today.  Dizziness you reported today doe snot seem to be concerning for a serious process. So continue monitoring for new symptoms.        Health Maintenance, Male Adopting a healthy lifestyle and getting preventive care are important in promoting health and wellness. Ask your health care provider about: The right schedule for you to have regular tests and exams. Things you can do on your own to prevent diseases and keep yourself healthy. What should I know about diet, weight, and exercise? Eat a healthy diet  Eat a diet that includes plenty of vegetables, fruits, low-fat dairy products, and lean protein. Do not eat a lot of foods that are high in solid fats, added sugars, or sodium. Maintain a healthy weight Body mass index (BMI) is a measurement that can be used to identify possible weight problems. It estimates body fat based on height and weight. Your health care provider can help determine your BMI and help you achieve or maintain a healthy weight. Get regular exercise Get regular exercise. This is one of the most important things you can do for your health. Most adults should: Exercise for at least 150 minutes each week. The exercise should increase your heart rate and make you sweat (moderate-intensity exercise). Do strengthening exercises at least twice a  week. This is in addition to the moderate-intensity exercise. Spend less time sitting. Even light physical activity can be beneficial. Watch cholesterol and blood lipids Have your blood tested for lipids and cholesterol at 64 years of age, then have this test every 5 years. You may need to have your cholesterol levels checked more often if: Your lipid or cholesterol levels are high. You are older than 64 years of age. You are at high risk for heart disease. What should I know about cancer screening? Many types of cancers can be detected early and may often be prevented. Depending on your health history and family history, you may need to have cancer screening at various ages. This may include screening for: Colorectal cancer. Prostate cancer. Skin cancer. Lung cancer. What should I know about heart disease, diabetes, and high blood pressure? Blood pressure and heart disease High blood pressure causes heart disease and increases the risk of stroke. This is more likely to develop in people who have high blood pressure readings or are overweight. Talk with your health care provider about your target blood pressure readings. Have your blood pressure checked: Every 3-5 years if you are 62-35 years of age. Every year if you are 89 years old or older. If you are between the ages of 17 and 18 and are a current or former smoker, ask your health care provider if you should have a one-time screening for abdominal aortic aneurysm (AAA). Diabetes Have regular diabetes screenings. This checks your fasting blood sugar level. Have the screening done: Once every three years after age  45 if you are at a normal weight and have a low risk for diabetes. More often and at a younger age if you are overweight or have a high risk for diabetes. What should I know about preventing infection? Hepatitis B If you have a higher risk for hepatitis B, you should be screened for this virus. Talk with your health care  provider to find out if you are at risk for hepatitis B infection. Hepatitis C Blood testing is recommended for: Everyone born from [birth year] through 1963. Anyone with known risk factors for hepatitis C. Sexually transmitted infections (STIs) You should be screened each year for STIs, including gonorrhea and chlamydia, if: You are sexually active and are younger than 64 years of age. You are older than 64 years of age and your health care provider tells you that you are at risk for this type of infection. Your sexual activity has changed since you were last screened, and you are at increased risk for chlamydia or gonorrhea. Ask your health care provider if you are at risk. Ask your health care provider about whether you are at high risk for HIV. Your health care provider may recommend a prescription medicine to help prevent HIV infection. If you choose to take medicine to prevent HIV, you should first get tested for HIV. You should then be tested every 3 months for as long as you are taking the medicine. Follow these instructions at home: Alcohol use Do not drink alcohol if your health care provider tells you not to drink. If you drink alcohol: Limit how much you have to 0-2 drinks a day. Know how much alcohol is in your drink. In the [place] one drink equals one 12 oz bottle of beer (355 mL), one 5 oz glass of wine (148 mL), or one 1 oz glass of hard liquor (44 mL). Lifestyle Do not use any products that contain nicotine or tobacco. These products include cigarettes, chewing tobacco, and vaping devices, such as e-cigarettes. If you need help quitting, ask your health care provider. Do not use street drugs. Do not share needles. Ask your health care provider for help if you need support or information about quitting drugs. General instructions Schedule regular health, dental, and eye exams. Stay current with your vaccines. Tell your health care provider if: You often feel depressed. You have  ever been abused or do not feel safe at home. Summary Adopting a healthy lifestyle and getting preventive care are important in promoting health and wellness. Follow your health care provider's instructions about healthy diet, exercising, and getting tested or screened for diseases. Follow your health care provider's instructions on monitoring your cholesterol and blood pressure. This information is not intended to replace advice given to you by your health care provider. Make sure you discuss any questions you have with your health care provider. Document Revised: 05/16/2020 Document Reviewed: 05/16/2020 Elsevier Patient Education  2023 [employer].

## 2021-11-17 NOTE — Assessment & Plan Note (Signed)
Continue a healthy lifestyle for diabetes prevention. Further recommendation will be given according to hemoglobin A1c done today.

## 2021-11-17 NOTE — Assessment & Plan Note (Addendum)
Continue Simvastatin 40 mg daily and low fat diet. FLP ordered today, further recommendation will be given according to results.

## 2021-11-17 NOTE — Assessment & Plan Note (Signed)
Problem is otherwise stable. Continue diltiazem 5 mg twice daily as needed. Follow-up in 6 months, before if needed.

## 2021-11-18 MED ORDER — SIMVASTATIN 40 MG PO TABS: ORAL_TABLET | ORAL | 3 refills | Status: DC

## 2021-11-23 ENCOUNTER — Other Ambulatory Visit: Payer: Self-pay | Admitting: Family Medicine

## 2021-11-23 DIAGNOSIS — N529 Male erectile dysfunction, unspecified: Secondary | ICD-10-CM

## 2022-01-18 ENCOUNTER — Other Ambulatory Visit: Payer: Self-pay | Admitting: Family Medicine

## 2022-01-18 DIAGNOSIS — F419 Anxiety disorder, unspecified: Secondary | ICD-10-CM

## 2022-01-18 DIAGNOSIS — H8110 Benign paroxysmal vertigo, unspecified ear: Secondary | ICD-10-CM

## 2022-01-19 NOTE — Telephone Encounter (Signed)
-  last filled 12/20/21 - next OV 05/18/22

## 2022-03-11 ENCOUNTER — Other Ambulatory Visit: Payer: Self-pay

## 2022-03-11 ENCOUNTER — Emergency Department
Admission: EM | Admit: 2022-03-11 | Discharge: 2022-03-11 | Disposition: A | Discharge status: Home / Self Care | Payer: BC Managed Care – PPO | Attending: Emergency Medicine | Admitting: Emergency Medicine

## 2022-03-11 DIAGNOSIS — R531 Weakness: Secondary | ICD-10-CM | POA: Insufficient documentation

## 2022-03-11 DIAGNOSIS — R55 Syncope and collapse: Secondary | ICD-10-CM | POA: Insufficient documentation

## 2022-03-11 DIAGNOSIS — R61 Generalized hyperhidrosis: Secondary | ICD-10-CM | POA: Diagnosis not present

## 2022-03-11 DIAGNOSIS — R42 Dizziness and giddiness: Secondary | ICD-10-CM | POA: Insufficient documentation

## 2022-03-11 LAB — COMPREHENSIVE METABOLIC PANEL
ALT: 21 U/L (ref 0–44)
AST: 27 U/L (ref 15–41)
Albumin: 4.1 g/dL (ref 3.5–5.0)
Alkaline Phosphatase: 60 U/L (ref 38–126)
Anion gap: 8 (ref 5–15)
BUN: 14 mg/dL (ref 8–23)
CO2: 26 mmol/L (ref 22–32)
Calcium: 9.1 mg/dL (ref 8.9–10.3)
Chloride: 104 mmol/L (ref 98–111)
Creatinine, Ser: 1.03 mg/dL (ref 0.61–1.24)
GFR, Estimated: >60 mL/min (ref >60)
Glucose, Bld: 128 mg/dL — ABNORMAL HIGH (ref 70–99)
Potassium: 3.6 mmol/L (ref 3.5–5.1)
Sodium: 138 mmol/L (ref 135–145)
Total Bilirubin: 0.7 mg/dL (ref 0.3–1.2)
Total Protein: 7.1 g/dL (ref 6.5–8.1)

## 2022-03-11 LAB — TROPONIN I (HIGH SENSITIVITY)
Troponin I (High Sensitivity): 4 ng/L (ref <18)
Troponin I (High Sensitivity): 4 ng/L (ref <18)

## 2022-03-11 LAB — CBC WITH DIFFERENTIAL/PLATELET
Abs Immature Granulocytes: 0.01 10*3/uL (ref 0.00–0.07)
Basophils Absolute: 0 10*3/uL (ref 0.0–0.1)
Basophils Relative: 1 %
Eosinophils Absolute: 0.1 10*3/uL (ref 0.0–0.5)
Eosinophils Relative: 2 %
HCT: 40.3 % (ref 39.0–52.0)
Hemoglobin: 13.4 g/dL (ref 13.0–17.0)
Immature Granulocytes: 0 %
Lymphocytes Relative: 37 %
Lymphs Abs: 1.7 10*3/uL (ref 0.7–4.0)
MCH: 28.7 pg (ref 26.0–34.0)
MCHC: 33.3 g/dL (ref 30.0–36.0)
MCV: 86.3 fL (ref 80.0–100.0)
Monocytes Absolute: 0.3 10*3/uL (ref 0.1–1.0)
Monocytes Relative: 8 %
Neutro Abs: 2.4 10*3/uL (ref 1.7–7.7)
Neutrophils Relative %: 52 %
Platelets: 294 10*3/uL (ref 150–400)
RBC: 4.67 MIL/uL (ref 4.22–5.81)
RDW: 13.5 % (ref 11.5–15.5)
WBC: 4.5 10*3/uL (ref 4.0–10.5)
nRBC: 0 % (ref 0.0–0.2)

## 2022-03-11 LAB — URINALYSIS, ROUTINE W REFLEX MICROSCOPIC
Bilirubin Urine: NEGATIVE
Glucose, UA: NEGATIVE mg/dL
Hgb urine dipstick: NEGATIVE
Ketones, ur: NEGATIVE mg/dL
Leukocytes,Ua: NEGATIVE
Nitrite: NEGATIVE
Protein, ur: NEGATIVE mg/dL
Specific Gravity, Urine: 1.008 (ref 1.005–1.030)
pH: 8 (ref 5.0–8.0)

## 2022-03-11 MED ORDER — SODIUM CHLORIDE 0.9 % IV BOLUS
500.0000 mL | Freq: Once | INTRAVENOUS | Status: AC
  Administered 2022-03-11: 500 mL via INTRAVENOUS

## 2022-03-11 NOTE — ED Notes (Signed)
Per pt and wife, pt had a UTI 4 years ago and presented with dizziness and syncope. Urine sample to be obtained and sent to lab. Dr. Cherylann Banas informed.

## 2022-03-11 NOTE — ED Notes (Signed)
Pt denies CP, SOB, headache. States did feel sweaty

## 2022-03-11 NOTE — ED Triage Notes (Signed)
Pt to ED via [facility], pt at church today felt dizzy, sat down in pew, did not feel any better so pt got up to go outside for air, pt then started to collapse, was caught and taken to vestibule where pt started to drink some water and passed out again. Pt endorses not eating this morning. Pt does not remember passing out and denies any pain.  Vitals: 158/84 100% RA 153 CBG 77p

## 2022-03-11 NOTE — Discharge Instructions (Signed)
Call Dr. Martin[place] in the next few days to schedule a follow-up.  Continue taking your normal medications as prescribed.  Return to the ER immediately for new, worsening, persistent weakness or lightheadedness, any recurrent episode of feeling like you are going to pass out, chest pain, palpitations, difficulty breathing, severe headache, fever, vomiting, or any other new or worsening symptoms that concern you.

## 2022-03-11 NOTE — ED Provider Notes (Signed)
[facility] Provider Note    Event Date/Time   First MD Initiated Contact with Patient 03/11/22 [id number]     (approximate)   History   Dizziness and Loss of Consciousness   HPI  Franklin Baker is a 67 y.o. male with history of hyperlipidemia, benign positional vertigo, allergies, and anxiety who presents with syncope while at church.  The patient states that he started to feel lightheaded and weak.  He became sweaty.  He sat down and felt slightly better but then started up again and felt worse.  He tried to go outside but then continued to feel lightheaded and passed out while in the vestibule of the church.  He hit his back but was let down and did not hit his head.  He was then given some water by bystanders and briefly passed out again.  He did not have any seizure activity.  The patient denies any vomiting diarrhea, chest pain, difficulty breathing, palpitations, fever, or recent illness.  He states he did not eat breakfast today but this is not necessarily unusual for him.  He takes Valium for vertigo and anxiety but only at night.  I reviewed the past medical records.  The patient has no recent ED visits or admissions.  His most recent outpatient encounter was on 05/12/2021 with primary care for follow-up of his chronic conditions.  He was on Valium at that time for benign positional vertigo.  Physical Exam   Triage Vital Signs: ED Triage Vitals [03/11/22 0858]  Enc Vitals Group     BP      Pulse      Resp      Temp      Temp src      SpO2      Weight 168 lb (76.2 kg)     Height 6' (1.829 m)     Head Circumference      Peak Flow      Pain Score 0     Pain Loc      Pain Edu?      Excl. in [place]?     Most recent vital signs: Vitals:   03/11/22 1130 03/11/22 1200  BP: 128/73 (!) 143/76  Pulse: 68 69  Resp: 17 19  Temp:    SpO2: 99% 99%     General: Awake, no distress.  CV:  Good peripheral perfusion.  Resp:  Normal effort.  Lungs  CTAB. Abd:  Soft and nontender.  No distention.  Other:  Moist mucous membranes.  EOMI.  PERRLA.  Motor intact in all extremities.  Normal coordination.   ED Results / Procedures / Treatments   Labs (all labs ordered are listed, but only abnormal results are displayed) Labs Reviewed  COMPREHENSIVE METABOLIC PANEL - Abnormal; Notable for the following components:      Result Value   Glucose, Bld 128 (*)    All other components within normal limits  URINALYSIS, ROUTINE W REFLEX MICROSCOPIC - Abnormal; Notable for the following components:   Color, Urine YELLOW (*)    APPearance HAZY (*)    All other components within normal limits  CBC WITH DIFFERENTIAL/PLATELET  TROPONIN I (HIGH SENSITIVITY)  TROPONIN I (HIGH SENSITIVITY)     EKG  ED ECG REPORT I, Arta Silence, the attending physician, personally viewed and interpreted this ECG.  Date: 03/11/2022 EKG Time: 0857 Rate: 66 Rhythm: normal sinus rhythm QRS Axis: normal Intervals: normal ST/T Wave abnormalities: Nonspecific ST abnormalities Narrative Interpretation: no  evidence of acute ischemia; no significant change when compared to EKG of 11/17/2018    RADIOLOGY    PROCEDURES:  Critical Care performed: No  Procedures   MEDICATIONS ORDERED IN ED: Medications  sodium chloride 0.9 % bolus 500 mL (0 mLs Intravenous Stopped 03/11/22 1015)     IMPRESSION / MDM / ASSESSMENT AND PLAN / ED COURSE  I reviewed the triage vital signs and the nursing notes.  67 year old male with PMH as noted above presents with a syncopal episode in church with a prodrome of lightheadedness and diaphoresis.  He came to and then briefly passed out a second time.  Currently, his vital signs are normal and physical exam is otherwise unremarkable.  Neurologic exam is nonfocal.  Differential diagnosis includes, but is not limited to, vasovagal syncope, dehydration, hypovolemia, electrolyte abnormality, infection, less likely cardiac  etiology.  We will obtain lab workup, give a fluid bolus, and reassess.  Patient's presentation is most consistent with acute complicated illness / injury requiring diagnostic workup.  The patient is on the cardiac monitor to evaluate for evidence of arrhythmia and/or significant heart rate changes.  ----------------------------------------- 12:18 PM on 03/11/2022 -----------------------------------------  Lab workup is unremarkable.  There is no anemia or leukocytosis.  Electrolytes are normal.  Troponins are negative x 2.  Urinalysis is negative.  On reassessment, the patient states he feels well and is asymptomatic.  He is ambulating with normal gait.  His wife witnessed most of the episode and confirms that the patient had no tremors or seizure-like activity.  I counseled the patient and wife on the results of the workup and possible causes of his symptoms.  I did consider whether the patient may benefit from inpatient admission for further monitoring and workup given his age and the repeat syncopal episode after the patient initially came to, however the overall presentation is still very consistent with vasovagal episode or other benign etiology.  I offered admission to the patient, however he states he feels well and would prefer to go home.  He has good access to his primary care provider and states he will call tomorrow.  I gave him strict return precautions and he expressed understanding.   FINAL CLINICAL IMPRESSION(S) / ED DIAGNOSES   Final diagnoses:  Vasovagal syncope     Rx / DC Orders   ED Discharge Orders     None        Note:  This document was prepared using Dragon voice recognition software and may include unintentional dictation errors.    Arta Silence, MD 03/11/22 1220

## 2022-03-31 ENCOUNTER — Other Ambulatory Visit: Payer: Self-pay | Admitting: Family Medicine

## 2022-03-31 DIAGNOSIS — L700 Acne vulgaris: Secondary | ICD-10-CM

## 2022-05-16 NOTE — Progress Notes (Signed)
HPI: Franklin Baker is a 67 y.o. male, who is here today for follow up. He was last seen on 11/17/21  Since his last visit he was evaluated in the ED for episode of syncope,.  The incident happened at church, EMS was called to the scene and he was transported to the ED.  States that he felt light-headed and dizzy, different from previous episodes related to his vertigo. Since then, he states he has been feeling good and has resumed driving.  Various tests were conducted, including troponins x 2 negative, CMP, CBC, and a UA.  Labs with no significant abnormalities, except for mildly elevated glucose, 128. Lab Results  Component Value Date   CREATININE 1.03 03/11/2022   BUN 14 03/11/2022   NA 138 03/11/2022   K 3.6 03/11/2022   CL 104 03/11/2022   CO2 26 03/11/2022   Lab Results  Component Value Date   ALT 21 03/11/2022   AST 27 03/11/2022   ALKPHOS 60 03/11/2022   BILITOT 0.7 03/11/2022   Lab Results  Component Value Date   WBC 4.5 03/11/2022   HGB 13.4 03/11/2022   HCT 40.3 03/11/2022   MCV 86.3 03/11/2022   PLT 294 03/11/2022   His hemoglobin A1c has been elevated in the past. Negative for polydipsia, polyuria, polyphagia. Lab Results  Component Value Date   HGBA1C 6.3 11/17/2021   He continues to engage in regular physical activities, including exercising three to four times a week and doing yard work, without experiencing chest pain, difficulty breathing, or palpitations.   Anxiety and vertigo: He is on Valium 5 mg, which he takes daily as needed and still helps. He has episodes of vertigo every few months, no new associated symptoms.  Described as spinning like sensation that last a few minutes. Exacerbated by lying down/head movement.  He has no concerns today.  Review of Systems  Constitutional:  Negative for activity change, appetite change and fever.  HENT:  Negative for mouth sores and sore throat.   Respiratory:  Negative for cough and wheezing.    Cardiovascular:  Negative for leg swelling.  Gastrointestinal:  Negative for abdominal pain, nausea and vomiting.  Genitourinary:  Negative for decreased urine volume, dysuria and hematuria.  Neurological:  Negative for syncope, weakness and headaches.  Psychiatric/Behavioral:  Negative for confusion and hallucinations. The patient is not nervous/anxious.   Rest see pertinent positives and negatives per HPI.  Current Outpatient Medications on File Prior to Visit  Medication Sig Dispense Refill   carbamide peroxide (DEBROX) 6.5 % OTIC solution Place 5 drops into the left ear 2 (two) times daily. 15 mL 0   Cholecalciferol (VITAMIN D) 2000 units CAPS Take 1 capsule by mouth daily.     clindamycin-benzoyl peroxide (BENZACLIN) gel APPLY TOPICALLY TO THE AFFECTED AREA AS NEEDED 100 g 2   fluticasone (FLONASE) 50 MCG/ACT nasal spray Place 1 spray into both nostrils daily.     Omega-3 Fatty Acids (FISH OIL TRIPLE STRENGTH) 1400 MG CAPS Take 1 capsule by mouth daily.     sildenafil (VIAGRA) 100 MG tablet TAKE 1 TABLET BY MOUTH DAILY AS NEEDED FOR ERECTILE DYSFUNCTION 18 tablet 5   simvastatin (ZOCOR) 40 MG tablet TAKE 1 TABLET(40 MG) BY MOUTH AT BEDTIME 90 tablet 3   No current facility-administered medications on file prior to visit.   Past Medical History:  Diagnosis Date   Allergy    Anxiety    Cataract    early cataracts  Hyperlipidemia    Vertigo, benign positional    No Known Allergies  Social History   Socioeconomic History   Marital status: Married    Spouse name: Not on file   Number of children: Not on file   Years of education: Not on file   Highest education level: Some college, no degree  Occupational History   Not on file  Tobacco Use   Smoking status: Never   Smokeless tobacco: Never  Vaping Use   Vaping Use: Never used  Substance and Sexual Activity   Alcohol use: Yes    Comment: occasionally    Drug use: No   Sexual activity: Not on file  Other Topics  Concern   Not on file  Social History Narrative   Not on file   Social Determinants of Health   Financial Resource Strain: Low Risk  (05/15/2022)   Overall Financial Resource Strain (CARDIA)    Difficulty of Paying Living Expenses: Not hard at all  Food Insecurity: No Food Insecurity (05/15/2022)   Hunger Vital Sign    Worried About Running Out of Food in the Last Year: Never true    Ran Out of Food in the Last Year: Never true  Transportation Needs: No Transportation Needs (05/15/2022)   PRAPARE - Administrator, Civil Service (Medical): No    Lack of Transportation (Non-Medical): No  Physical Activity: Sufficiently Active (05/15/2022)   Exercise Vital Sign    Days of Exercise per Week: 4 days    Minutes of Exercise per Session: 80 min  Stress: No Stress Concern Present (05/15/2022)   [employer] of Occupational Health - Occupational Stress Questionnaire    Feeling of Stress : Only a little  Social Connections: Unknown (05/15/2022)   Social Connection and Isolation Panel [NHANES]    Frequency of Communication with Friends and Family: More than three times a week    Frequency of Social Gatherings with Friends and Family: Twice a week    Attends Religious Services: More than 4 times per year    Active Member of Clubs or Organizations: Patient declined    Attends Banker Meetings: Not on file    Marital Status: Married   Vitals:   05/18/22 0852  BP: 122/70  Pulse: 64  Resp: 16  Temp: (!) 97.5 F (36.4 C)  SpO2: 98%   Body mass index is 24.14 kg/m.  Physical Exam Vitals and nursing note reviewed.  Constitutional:      General: He is not in acute distress.    Appearance: He is well-developed.  HENT:     Head: Normocephalic and atraumatic.     Mouth/Throat:     Mouth: Mucous membranes are moist.     Pharynx: Oropharynx is clear.  Eyes:     Conjunctiva/sclera: Conjunctivae normal.  Cardiovascular:     Rate and Rhythm: Normal rate and regular  rhythm.     Pulses:          Posterior tibial pulses are 2+ on the right side and 2+ on the left side.     Heart sounds: No murmur heard. Pulmonary:     Effort: Pulmonary effort is normal. No respiratory distress.     Breath sounds: Normal breath sounds.  Abdominal:     Palpations: Abdomen is soft. There is no hepatomegaly or mass.     Tenderness: There is no abdominal tenderness.  Lymphadenopathy:     Cervical: No cervical adenopathy.  Skin:    General:  Skin is warm.     Findings: No erythema.  Neurological:     Mental Status: He is alert and oriented to person, place, and time.     Gait: Gait normal.  Psychiatric:        Mood and Affect: Mood and affect normal.        Thought Content: Thought content does not include suicidal ideation.   ASSESSMENT AND PLAN:  Franklin Baker was seen today for follow-up.  Diagnoses and all orders for this visit:  Anxiety disorder, unspecified type Assessment & Plan: Problem has been table. Continue diltiazem 5 mg twice daily as needed. Follow-up in 6 months, before if needed.  Orders: -     diazePAM; TAKE 1 TABLET BY MOUTH EVERY 12 HOURS AS NEEDED FOR ANXIETY. NOT TO EXCEED 40 TABLETS PER MONTH  Dispense: 40 tablet; Refill: 3  Benign paroxysmal positional vertigo, unspecified laterality Assessment & Plan: Problem has been stable. Continue Valium 5 mg bid prn, prescription sent. Continue fall precautions.  Orders: -     diazePAM; TAKE 1 TABLET BY MOUTH EVERY 12 HOURS AS NEEDED FOR ANXIETY. NOT TO EXCEED 40 TABLETS PER MONTH  Dispense: 40 tablet; Refill: 3  Vasovagal syncope Without residual symptoms and has not reoccurred. We discussed possible etiologies. Workup in the ED otherwise negative. Instructed about warning signs.  Prediabetes Assessment & Plan: Last hemoglobin A1c 6.3, 11/2021. Continue a healthy lifestyle for diabetes prevention. We will plan on checking hemoglobin A1c next visit.  Return in about 27 weeks (around  11/23/2022) for CPE.  Franklin Orona G. [place], MD  [facility]. [place] office.

## 2022-05-18 ENCOUNTER — Encounter: Payer: Self-pay | Admitting: Family Medicine

## 2022-05-18 ENCOUNTER — Ambulatory Visit: Payer: No Typology Code available for payment source | Admitting: Family Medicine

## 2022-05-18 VITALS — BP 122/70 | HR 64 | Temp 97.5°F | Resp 16 | Ht 72.0 in | Wt 178.0 lb

## 2022-05-18 DIAGNOSIS — R7303 Prediabetes: Secondary | ICD-10-CM

## 2022-05-18 DIAGNOSIS — R55 Syncope and collapse: Secondary | ICD-10-CM

## 2022-05-18 DIAGNOSIS — E785 Hyperlipidemia, unspecified: Secondary | ICD-10-CM

## 2022-05-18 DIAGNOSIS — H8110 Benign paroxysmal vertigo, unspecified ear: Secondary | ICD-10-CM | POA: Diagnosis not present

## 2022-05-18 DIAGNOSIS — F419 Anxiety disorder, unspecified: Secondary | ICD-10-CM | POA: Diagnosis not present

## 2022-05-18 MED ORDER — DIAZEPAM 5 MG PO TABS: ORAL_TABLET | ORAL | 3 refills | Status: DC

## 2022-05-18 NOTE — Assessment & Plan Note (Signed)
Problem has been stable. Continue Valium 5 mg bid prn, prescription sent. Continue fall precautions.

## 2022-05-18 NOTE — Patient Instructions (Addendum)
A few things to remember from today's visit:  Hyperlipidemia, unspecified hyperlipidemia type  Anxiety disorder, unspecified type - Plan: diazepam (VALIUM) 5 MG tablet  Benign paroxysmal positional vertigo, unspecified laterality - Plan: diazepam (VALIUM) 5 MG tablet  Vasovagal syncope  No changes today.  If you need refills for medications you take chronically, please call your pharmacy. Do not use My Chart to request refills or for acute issues that need immediate attention. If you send a my chart message, it may take a few days to be addressed, specially if I am not in the office.  Please be sure medication list is accurate. If a new problem present, please set up appointment sooner than planned today.

## 2022-05-18 NOTE — Assessment & Plan Note (Addendum)
Last hemoglobin A1c 6.3, 11/2021. Continue a healthy lifestyle for diabetes prevention. We will plan on checking hemoglobin A1c next visit.

## 2022-05-18 NOTE — Assessment & Plan Note (Signed)
Problem has been table. Continue diltiazem 5 mg twice daily as needed. Follow-up in 6 months, before if needed.

## 2022-06-28 ENCOUNTER — Encounter: Payer: Self-pay | Admitting: Internal Medicine

## 2022-07-09 NOTE — Progress Notes (Unsigned)
ACUTE VISIT No chief complaint on file.  HPI: Franklin Baker is a 67 y.o. male, who is here today complaining of *** HPI  Review of Systems See other pertinent positives and negatives in HPI.  Current Outpatient Medications on File Prior to Visit  Medication Sig Dispense Refill   carbamide peroxide (DEBROX) 6.5 % OTIC solution Place 5 drops into the left ear 2 (two) times daily. 15 mL 0   Cholecalciferol (VITAMIN D) 2000 units CAPS Take 1 capsule by mouth daily.     clindamycin-benzoyl peroxide (BENZACLIN) gel APPLY TOPICALLY TO THE AFFECTED AREA AS NEEDED 100 g 2   diazepam (VALIUM) 5 MG tablet TAKE 1 TABLET BY MOUTH EVERY 12 HOURS AS NEEDED FOR ANXIETY. NOT TO EXCEED 40 TABLETS PER MONTH 40 tablet 3   fluticasone (FLONASE) 50 MCG/ACT nasal spray Place 1 spray into both nostrils daily.     Omega-3 Fatty Acids (FISH OIL TRIPLE STRENGTH) 1400 MG CAPS Take 1 capsule by mouth daily.     sildenafil (VIAGRA) 100 MG tablet TAKE 1 TABLET BY MOUTH DAILY AS NEEDED FOR ERECTILE DYSFUNCTION 18 tablet 5   simvastatin (ZOCOR) 40 MG tablet TAKE 1 TABLET(40 MG) BY MOUTH AT BEDTIME 90 tablet 3   No current facility-administered medications on file prior to visit.    Past Medical History:  Diagnosis Date   Allergy    Anxiety    Cataract    early cataracts    Hyperlipidemia    Vertigo, benign positional    No Known Allergies  Social History   Socioeconomic History   Marital status: Married    Spouse name: Not on file   Number of children: Not on file   Years of education: Not on file   Highest education level: Some college, no degree  Occupational History   Not on file  Tobacco Use   Smoking status: Never   Smokeless tobacco: Never  Vaping Use   Vaping Use: Never used  Substance and Sexual Activity   Alcohol use: Yes    Comment: occasionally    Drug use: No   Sexual activity: Not on file  Other Topics Concern   Not on file  Social History Narrative   Not on file    Social Determinants of Health   Financial Resource Strain: Low Risk  (05/15/2022)   Overall Financial Resource Strain (CARDIA)    Difficulty of Paying Living Expenses: Not hard at all  Food Insecurity: No Food Insecurity (05/15/2022)   Hunger Vital Sign    Worried About Running Out of Food in the Last Year: Never true    Ran Out of Food in the Last Year: Never true  Transportation Needs: No Transportation Needs (05/15/2022)   PRAPARE - Administrator, Civil Service (Medical): No    Lack of Transportation (Non-Medical): No  Physical Activity: Sufficiently Active (05/15/2022)   Exercise Vital Sign    Days of Exercise per Week: 4 days    Minutes of Exercise per Session: 80 min  Stress: No Stress Concern Present (05/15/2022)   [employer] of Occupational Health - Occupational Stress Questionnaire    Feeling of Stress : Only a little  Social Connections: Unknown (05/15/2022)   Social Connection and Isolation Panel [NHANES]    Frequency of Communication with Friends and Family: More than three times a week    Frequency of Social Gatherings with Friends and Family: Twice a week    Attends Religious Services: More than 4  times per year    Active Member of Clubs or Organizations: Patient declined    Attends Banker Meetings: Not on file    Marital Status: Married    There were no vitals filed for this visit. There is no height or weight on file to calculate BMI.  Physical Exam  ASSESSMENT AND PLAN: There are no diagnoses linked to this encounter.  No follow-ups on file.  Yitzel Shasteen G. [place], MD  [facility]. [place] office.  Discharge Instructions   None

## 2022-07-10 ENCOUNTER — Ambulatory Visit: Payer: No Typology Code available for payment source | Admitting: Family Medicine

## 2022-07-10 ENCOUNTER — Encounter: Payer: Self-pay | Admitting: Family Medicine

## 2022-07-10 VITALS — BP 120/80 | HR 65 | Temp 98.0°F | Resp 16 | Ht 72.0 in | Wt 181.0 lb

## 2022-07-10 DIAGNOSIS — H8110 Benign paroxysmal vertigo, unspecified ear: Secondary | ICD-10-CM | POA: Diagnosis not present

## 2022-07-10 MED ORDER — MECLIZINE HCL 25 MG PO TABS: 25.0000 mg | ORAL_TABLET | Freq: Three times a day (TID) | ORAL | 0 refills | Status: DC | PRN

## 2022-07-10 NOTE — Patient Instructions (Addendum)
A few things to remember from today's visit:  Benign paroxysmal positional vertigo, unspecified laterality - Plan: meclizine (ANTIVERT) 25 MG tablet Try meclizine 25 mg 3 times per day for 2 days then at bedtime for 5 days. Monitor for new symptoms. Please let me know if you change your mind about vestibular exercises. If problem gets worse, we could arrange appointment with ENT.  If you need refills for medications you take chronically, please call your pharmacy. Do not use My Chart to request refills or for acute issues that need immediate attention. If you send a my chart message, it may take a few days to be addressed, specially if I am not in the office.  Please be sure medication list is accurate. If a new problem present, please set up appointment sooner than planned today.

## 2022-09-30 ENCOUNTER — Other Ambulatory Visit: Payer: Self-pay | Admitting: Family Medicine

## 2022-09-30 DIAGNOSIS — F419 Anxiety disorder, unspecified: Secondary | ICD-10-CM

## 2022-09-30 DIAGNOSIS — H8110 Benign paroxysmal vertigo, unspecified ear: Secondary | ICD-10-CM

## 2022-10-01 NOTE — Telephone Encounter (Signed)
-   last OV 07/2022 - last filled 08/18/22 - upcoming appt 11/2022

## 2022-10-12 IMAGING — DX DG CERVICAL SPINE COMPLETE 4+V
5 series · 5 of 5 positions shown · non-contrast
Comparison: No prior.

CLINICAL DATA: Cervical pain.

EXAM:
CERVICAL SPINE - COMPLETE 4+ VIEW

[cervical spine open mouth ap (1 of 2)]
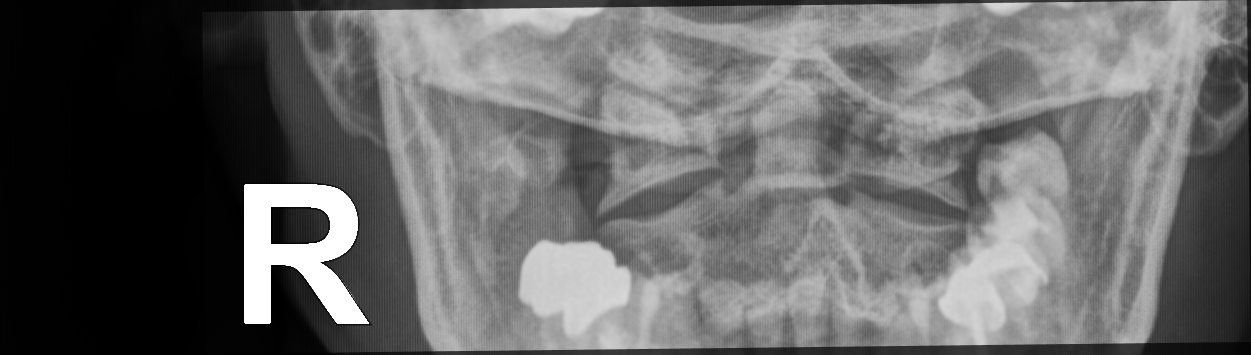

[cervical spine oblique (1 of 2)]
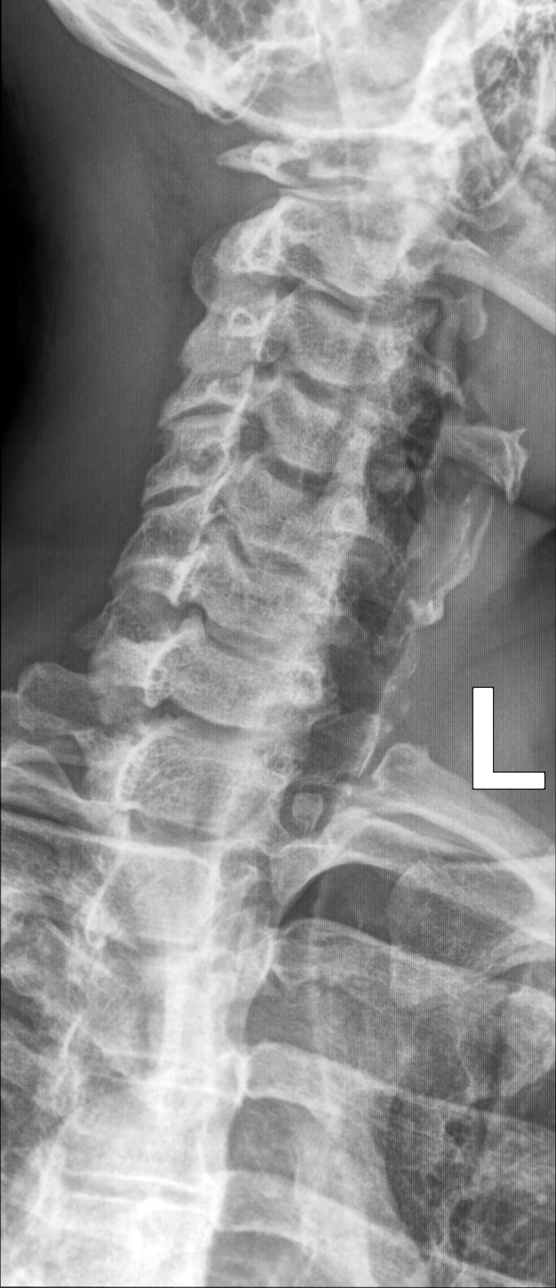

[cervical spine oblique (2 of 2)]
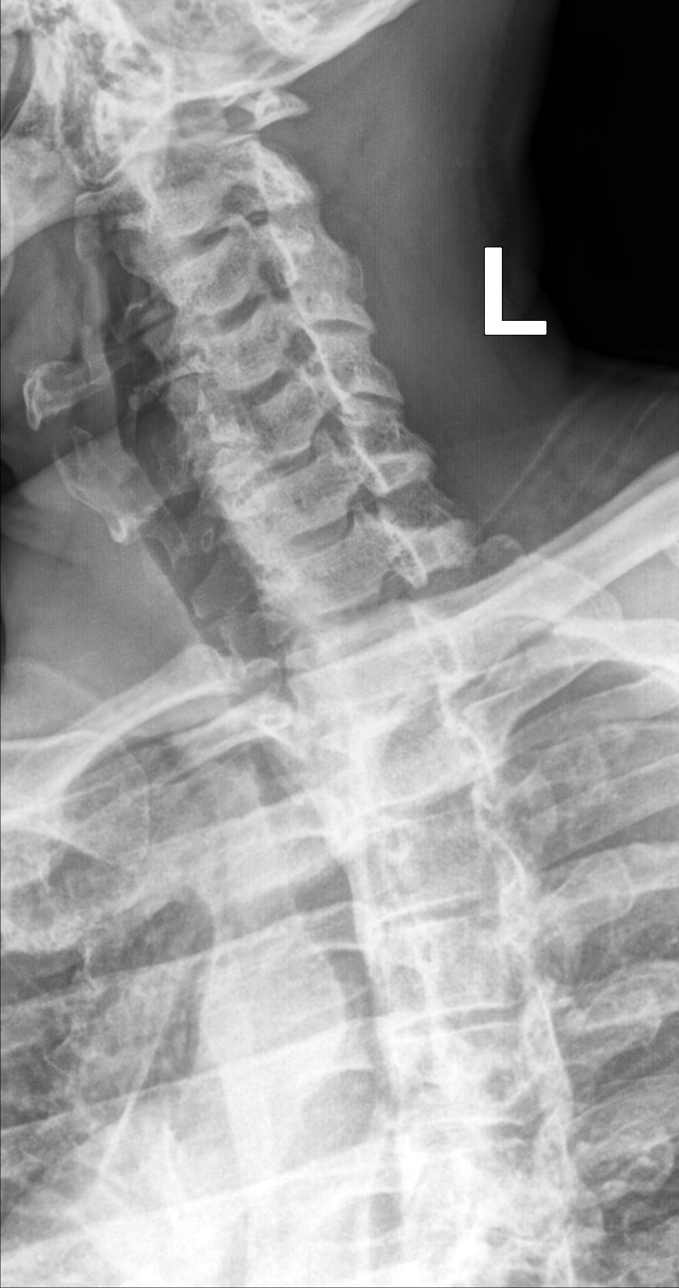

[cervical spine ap]
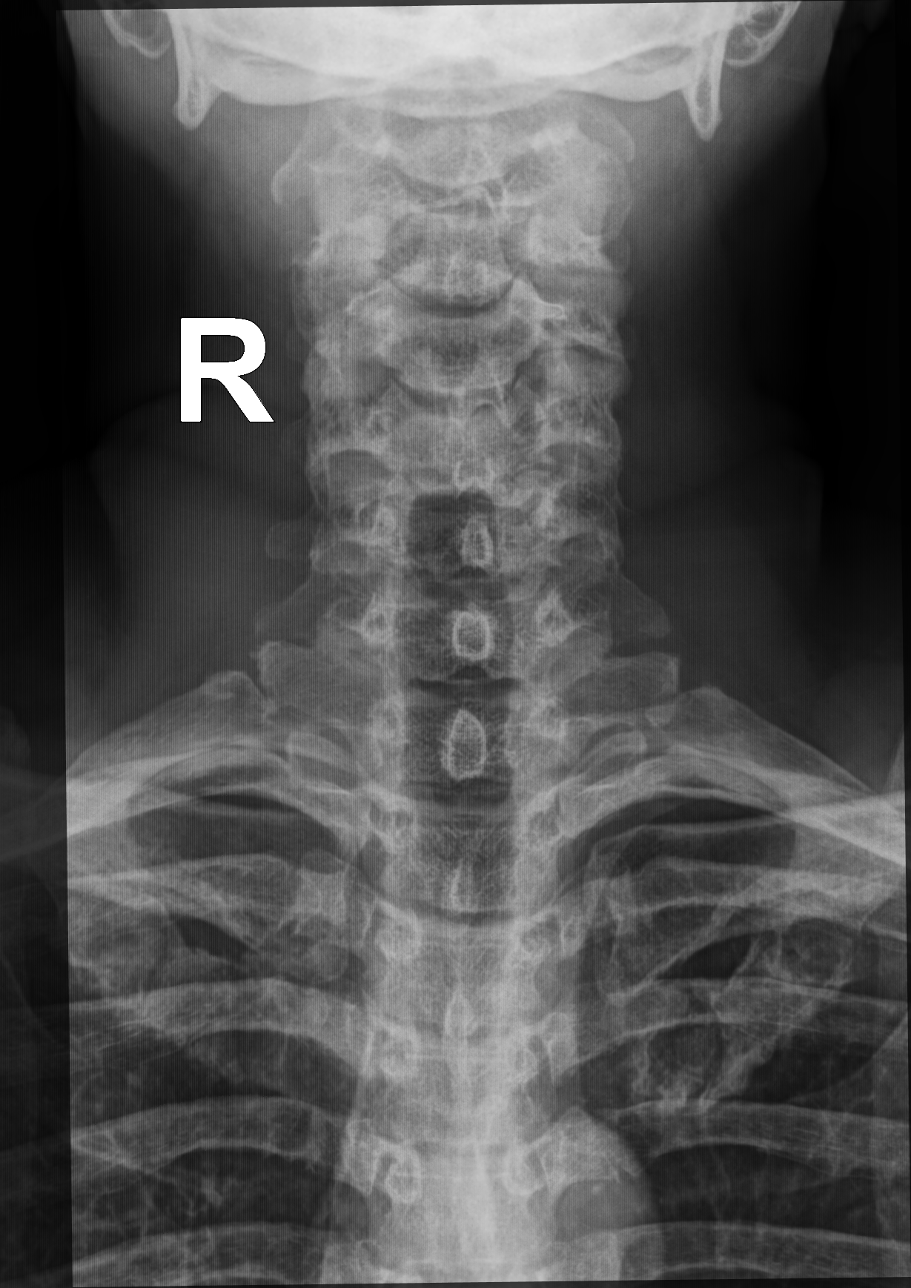

[cervical spine open mouth ap (2 of 2)]
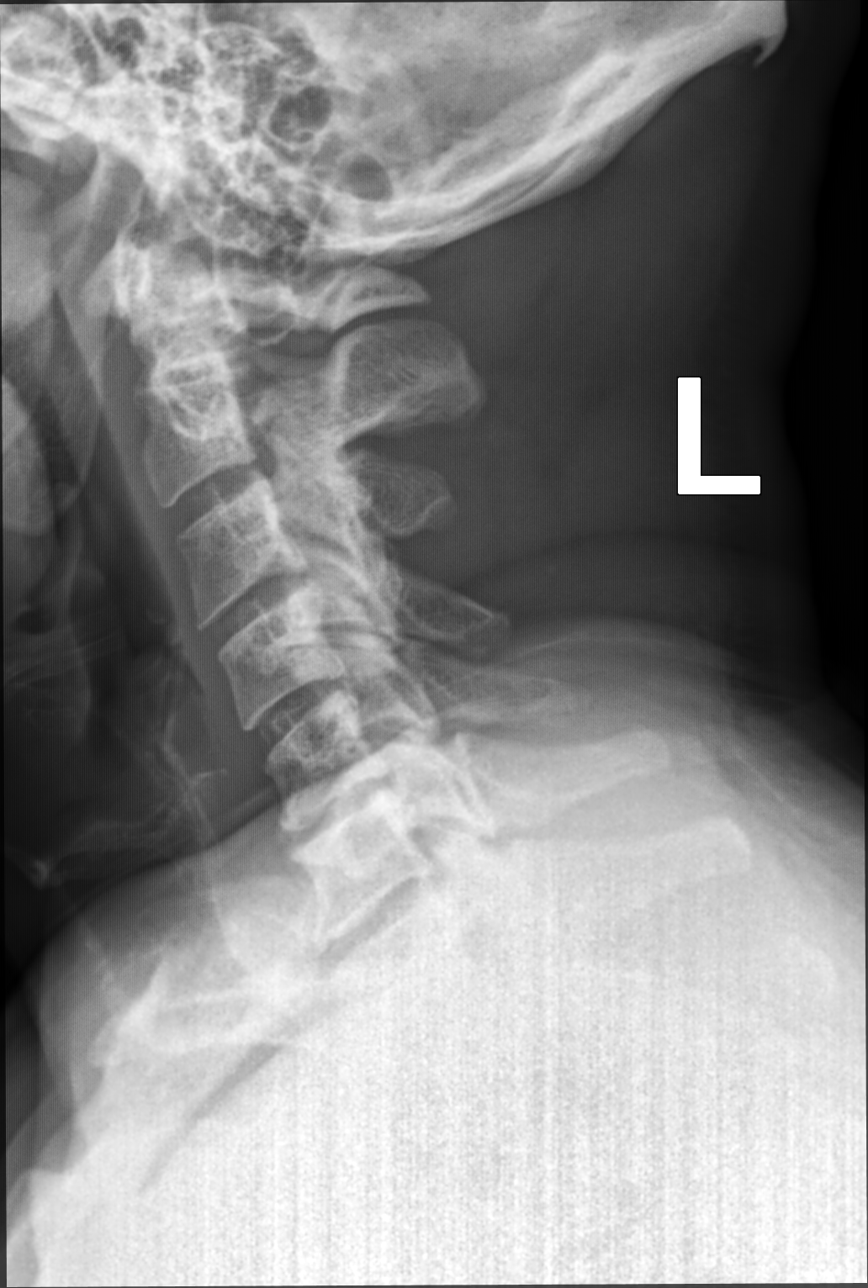

[5 of 5 positions shown; findings below may reference images not displayed]

FINDINGS: C7 incompletely visualized. C5-C6 and C6-C7 disc degeneration
endplate osteophyte formation. Diffuse facet hypertrophy. No acute
bony abnormality. No evidence of fracture or dislocation. Pulmonary
apices are clear.
IMPRESSION: C5-C6 and C6-C7 disc degeneration and endplate osteophyte formation.
Diffuse facet hypertrophy. Bilateral multifocal neural foraminal
narrowing. No acute bony abnormality.

## 2022-11-05 ENCOUNTER — Ambulatory Visit (AMBULATORY_SURGERY_CENTER): Payer: No Typology Code available for payment source

## 2022-11-05 VITALS — Ht 72.0 in | Wt 187.0 lb

## 2022-11-05 DIAGNOSIS — Z800 Family history of malignant neoplasm of digestive organs: Secondary | ICD-10-CM

## 2022-11-05 MED ORDER — NA SULFATE-K SULFATE-MG SULF 17.5-3.13-1.6 GM/177ML PO SOLN: 1.0000 | Freq: Once | ORAL | 0 refills | Status: AC

## 2022-11-05 NOTE — Progress Notes (Signed)
Pre visit completed via phone call; Patient verified name, DOB, and address; No egg or soy allergy known to patient;  No issues known to pt with past sedation with any surgeries or procedures; Patient denies ever being told they had issues or difficulty with intubation;  No FH of Malignant Hyperthermia; Pt is not on diet pills; Pt is not on home 02;  Pt is not on blood thinners;  Pt denies issues with constipation;  No A fib or A flutter; Have any cardiac testing pending--NO Insurance verified during PV appt---[employer] Pt can ambulate without assistance;  Pt denies use of chewing tobacco; Discussed diabetic/weight loss medication holds; Discussed NSAID holds; Checked BMI to be less than 50; Pt instructed to use Singlecare.com or GoodRx for a price reduction on prep; Patient's chart reviewed by Cathlyn Parsons CNRA prior to previsit and patient appropriate for the [facility]; Pre visit completed and red dot placed by patient's name on their procedure day (on provider's schedule);    Instructions sent to MyChart as well as printed and mailed to the patient per his request;

## 2022-11-14 ENCOUNTER — Ambulatory Visit: Payer: Medicare Other | Admitting: Family Medicine

## 2022-11-14 ENCOUNTER — Encounter: Payer: Self-pay | Admitting: Family Medicine

## 2022-11-14 VITALS — BP 145/90 | HR 60 | Temp 97.8°F | Resp 12 | Ht 72.0 in | Wt 188.1 lb

## 2022-11-14 DIAGNOSIS — R7303 Prediabetes: Secondary | ICD-10-CM

## 2022-11-14 DIAGNOSIS — R030 Elevated blood-pressure reading, without diagnosis of hypertension: Secondary | ICD-10-CM | POA: Diagnosis not present

## 2022-11-14 DIAGNOSIS — H8110 Benign paroxysmal vertigo, unspecified ear: Secondary | ICD-10-CM | POA: Diagnosis not present

## 2022-11-14 DIAGNOSIS — F419 Anxiety disorder, unspecified: Secondary | ICD-10-CM

## 2022-11-14 DIAGNOSIS — E785 Hyperlipidemia, unspecified: Secondary | ICD-10-CM | POA: Diagnosis not present

## 2022-11-14 LAB — COMPREHENSIVE METABOLIC PANEL
ALT: 21 U/L (ref 0–53)
AST: 23 U/L (ref 0–37)
Albumin: 4.6 g/dL (ref 3.5–5.2)
Alkaline Phosphatase: 65 U/L (ref 39–117)
BUN: 10 mg/dL (ref 6–23)
CO2: 30 meq/L (ref 19–32)
Calcium: 9.9 mg/dL (ref 8.4–10.5)
Chloride: 104 meq/L (ref 96–112)
Creatinine, Ser: 1.1 mg/dL (ref 0.40–1.50)
GFR: 70.69 mL/min (ref >60.00)
Glucose, Bld: 107 mg/dL — ABNORMAL HIGH (ref 70–99)
Potassium: 3.8 meq/L (ref 3.5–5.1)
Sodium: 140 meq/L (ref 135–145)
Total Bilirubin: 0.6 mg/dL (ref 0.2–1.2)
Total Protein: 7.6 g/dL (ref 6.0–8.3)

## 2022-11-14 LAB — LIPID PANEL
Cholesterol: 188 mg/dL (ref 0–200)
HDL: 52.7 mg/dL (ref >39.00)
LDL Cholesterol: 107 mg/dL — ABNORMAL HIGH (ref 0–99)
NonHDL: 135.5
Total CHOL/HDL Ratio: 4
Triglycerides: 143 mg/dL (ref 0.0–149.0)
VLDL: 28.6 mg/dL (ref 0.0–40.0)

## 2022-11-14 LAB — HEMOGLOBIN A1C: Hgb A1c MFr Bld: 6.1 % (ref 4.6–6.5)

## 2022-11-14 NOTE — Progress Notes (Signed)
HPI: Franklin Baker is a 67 y.o. male with a PMHx significant for vertigo, HLD, prediabetes, and anxiety, who is here today for chronic disease management.  Last seen on 07/10/2022  Vertigo/episodes of dizziness:  He states he hasn't had any episodes since his last visit.  He denies any recent chest pain or SOB.   -BP elevated today. He checks BP at home sometimes, 120's/70-80.  Hyperlipidemia: Currently on simvastatin 40 mg daily.  Following a low fat diet: He states he is trying to avoid fried foods. He goes to the gym and walks regularly for exercise.  Side effects from medication: none Lab Results  Component Value Date   CHOL 177 11/17/2021   HDL 42.20 11/17/2021   LDLCALC 104 (H) 11/17/2021   TRIG 151.0 (H) 11/17/2021   CHOLHDL 4 11/17/2021   Prediabtetes:Negative for abdominal pain, nausea,vomiting, polydipsia,polyuria, or polyphagia.  Lab Results  Component Value Date   HGBA1C 6.3 11/17/2021   Anxiety: He is still taking diazepam 5 mg bid prn.  Medication has been well tolerated. Negative for depression like symptoms.  Review of Systems  Constitutional:  Negative for activity change, appetite change and fever.  HENT:  Negative for nosebleeds and sore throat.   Eyes:  Negative for redness and visual disturbance.  Respiratory:  Negative for cough and wheezing.   Cardiovascular:  Negative for palpitations and leg swelling.  Endocrine: Negative for cold intolerance and heat intolerance.  Genitourinary:  Negative for decreased urine volume, dysuria and hematuria.  Neurological:  Negative for syncope, facial asymmetry, weakness and headaches.  Psychiatric/Behavioral:  Negative for confusion and hallucinations.   See other pertinent positives and negatives in HPI.  Current Outpatient Medications on File Prior to Visit  Medication Sig Dispense Refill   carbamide peroxide (DEBROX) 6.5 % OTIC solution Place 5 drops into the left ear 2 (two) times daily. 15 mL 0    clindamycin-benzoyl peroxide (BENZACLIN) gel APPLY TOPICALLY TO THE AFFECTED AREA AS NEEDED 100 g 2   diazepam (VALIUM) 5 MG tablet TAKE 1 TABLET BY MOUTH EVERY 12 HOURS AS NEEDED FOR ANXIETY. NOT TO EXCEED 40 TABLETS PER MONTH 40 tablet 3   Omega-3 Fatty Acids (FISH OIL TRIPLE STRENGTH) 1400 MG CAPS Take 1 capsule by mouth daily.     sildenafil (VIAGRA) 100 MG tablet TAKE 1 TABLET BY MOUTH DAILY AS NEEDED FOR ERECTILE DYSFUNCTION 18 tablet 5   simvastatin (ZOCOR) 40 MG tablet TAKE 1 TABLET(40 MG) BY MOUTH AT BEDTIME 90 tablet 3   No current facility-administered medications on file prior to visit.   Past Medical History:  Diagnosis Date   Allergy    Anxiety    Cataract    early cataracts    Hyperlipidemia    Vertigo, benign positional    No Known Allergies  Social History   Socioeconomic History   Marital status: Married    Spouse name: Not on file   Number of children: Not on file   Years of education: Not on file   Highest education level: Some college, no degree  Occupational History   Not on file  Tobacco Use   Smoking status: Never   Smokeless tobacco: Never  Vaping Use   Vaping status: Never Used  Substance and Sexual Activity   Alcohol use: Yes    Comment: occasionally    Drug use: No   Sexual activity: Not on file  Other Topics Concern   Not on file  Social History Narrative  Not on file   Social Determinants of Health   Financial Resource Strain: Low Risk  (11/11/2022)   Overall Financial Resource Strain (CARDIA)    Difficulty of Paying Living Expenses: Not hard at all  Food Insecurity: No Food Insecurity (11/11/2022)   Hunger Vital Sign    Worried About Running Out of Food in the Last Year: Never true    Ran Out of Food in the Last Year: Never true  Transportation Needs: No Transportation Needs (05/15/2022)   PRAPARE - Administrator, Civil Service (Medical): No    Lack of Transportation (Non-Medical): No  Physical Activity: Sufficiently  Active (11/11/2022)   Exercise Vital Sign    Days of Exercise per Week: 4 days    Minutes of Exercise per Session: 90 min  Stress: No Stress Concern Present (11/11/2022)   [employer] of Occupational Health - Occupational Stress Questionnaire    Feeling of Stress : Not at all  Social Connections: Unknown (11/11/2022)   Social Connection and Isolation Panel [NHANES]    Frequency of Communication with Friends and Family: More than three times a week    Frequency of Social Gatherings with Friends and Family: Twice a week    Attends Religious Services: More than 4 times per year    Active Member of Clubs or Organizations: Patient declined    Attends Banker Meetings: Not on file    Marital Status: Married   Today's Vitals   11/14/22 0951 11/14/22 1032  BP: (!) 142/90 (!) 145/90  Pulse: 60   Resp: 12   Temp: 97.8 F (36.6 C)   TempSrc: Oral   SpO2: 98%   Weight: 188 lb 2 oz (85.3 kg)   Height: 6' (1.829 m)    Body mass index is 25.51 kg/m.  Physical Exam Vitals and nursing note reviewed.  Constitutional:      General: He is not in acute distress.    Appearance: He is well-developed.  HENT:     Head: Normocephalic and atraumatic.     Mouth/Throat:     Mouth: Mucous membranes are moist.  Eyes:     Conjunctiva/sclera: Conjunctivae normal.  Cardiovascular:     Rate and Rhythm: Normal rate and regular rhythm.     Pulses:          Posterior tibial pulses are 2+ on the right side and 2+ on the left side.     Heart sounds: No murmur heard. Pulmonary:     Effort: Pulmonary effort is normal. No respiratory distress.     Breath sounds: Normal breath sounds.  Abdominal:     Palpations: Abdomen is soft. There is no mass.     Tenderness: There is no abdominal tenderness.  Musculoskeletal:     Right lower leg: No edema.     Left lower leg: No edema.  Lymphadenopathy:     Cervical: No cervical adenopathy.  Skin:    General: Skin is warm.     Findings: No  erythema or rash.  Neurological:     Mental Status: He is alert and oriented to person, place, and time.     Cranial Nerves: No cranial nerve deficit.     Gait: Gait normal.  Psychiatric:     Comments: Well groomed, good eye contact.    ASSESSMENT AND PLAN:  [name] was seen today for chronic disease management.   Orders Placed This Encounter  Procedures   Comprehensive metabolic panel   Hemoglobin A1c  Lipid panel   Lab Results  Component Value Date   HGBA1C 6.1 11/14/2022   Lab Results  Component Value Date   CHOL 188 11/14/2022   HDL 52.70 11/14/2022   LDLCALC 107 (H) 11/14/2022   TRIG 143.0 11/14/2022   CHOLHDL 4 11/14/2022   Lab Results  Component Value Date   NA 140 11/14/2022   CL 104 11/14/2022   K 3.8 11/14/2022   CO2 30 11/14/2022   BUN 10 11/14/2022   CREATININE 1.10 11/14/2022   GFR 70.69 11/14/2022   CALCIUM 9.9 11/14/2022   ALBUMIN 4.6 11/14/2022   GLUCOSE 107 (H) 11/14/2022   Lab Results  Component Value Date   ALT 21 11/14/2022   AST 23 11/14/2022   ALKPHOS 65 11/14/2022   BILITOT 0.6 11/14/2022   Prediabetes Assessment & Plan: Last hemoglobin A1c 6.3, 11/2021. Continue a healthy lifestyle for diabetes prevention. Further recommendations according to HgA1C result.  Orders: -     Hemoglobin A1c; Future  Benign paroxysmal positional vertigo, unspecified laterality Assessment & Plan: He has not had another episode since his last visit. We discussed Dx and prognosis. Fall precautions. Instructed about warning signs.   Anxiety disorder, unspecified type Assessment & Plan: Problem is stable. Continue diltiazem 5 mg twice daily as needed. Follow-up in 6 months.   Hyperlipidemia, unspecified hyperlipidemia type Assessment & Plan: Continue Simvastatin 40 mg daily and low fat diet. Further recommendation will be given according to results.  Orders: -     Comprehensive metabolic panel; Future -     Lipid panel;  Future  Elevated blood pressure reading Reporting home BP's 120's/70-80. Continue monitoring BP regularly. Low salt/DASH diet recommended. F/U in 6 months.  Return in about 6 months (around 05/14/2023) for chronic problems.  I, Rolla Etienne Wierda, acting as a scribe for Drucella Karbowski [place], MD., have documented all relevant documentation on the behalf of Travion Ke [place], MD, as directed by  Romir Klimowicz [place], MD while in the presence of Esperansa Sarabia [place], MD.   I, Arella Blinder [place], MD, have reviewed all documentation for this visit. The documentation on 11/14/22 for the exam, diagnosis, procedures, and orders are all accurate and complete.  Teka Chanda G. [place], MD  [facility]. [place] [facility].

## 2022-11-14 NOTE — Patient Instructions (Addendum)
A few things to remember from today's visit:  Prediabetes - Plan: Hemoglobin A1c  Benign paroxysmal positional vertigo, unspecified laterality  Anxiety disorder, unspecified type  Hyperlipidemia, unspecified hyperlipidemia type - Plan: Comprehensive metabolic panel, Lipid panel No changes today. Monitor blood rpessure at home.  If you need refills for medications you take chronically, please call your pharmacy. Do not use My Chart to request refills or for acute issues that need immediate attention. If you send a my chart message, it may take a few days to be addressed, specially if I am not in the office.  Please be sure medication list is accurate. If a new problem present, please set up appointment sooner than planned today.

## 2022-11-15 ENCOUNTER — Encounter: Payer: Self-pay | Admitting: Family Medicine

## 2022-11-15 MED ORDER — SIMVASTATIN 40 MG PO TABS: ORAL_TABLET | ORAL | 3 refills | Status: DC

## 2022-11-15 NOTE — Assessment & Plan Note (Signed)
Problem is stable. Continue diltiazem 5 mg twice daily as needed. Follow-up in 6 months.

## 2022-11-15 NOTE — Assessment & Plan Note (Addendum)
He has not had another episode since his last visit. We discussed Dx and prognosis. Fall precautions. Instructed about warning signs.

## 2022-11-15 NOTE — Assessment & Plan Note (Signed)
Last hemoglobin A1c 6.3, 11/2021. Continue a healthy lifestyle for diabetes prevention. Further recommendations according to HgA1C result.

## 2022-11-15 NOTE — Assessment & Plan Note (Signed)
Continue Simvastatin 40 mg daily and low fat diet. Further recommendation will be given according to results.

## 2022-11-16 ENCOUNTER — Ambulatory Visit: Payer: No Typology Code available for payment source | Admitting: Family Medicine

## 2022-11-28 ENCOUNTER — Encounter: Payer: Self-pay | Admitting: Internal Medicine

## 2022-11-28 ENCOUNTER — Ambulatory Visit: Payer: Medicare Other | Admitting: Internal Medicine

## 2022-11-28 VITALS — BP 112/83 | HR 69 | Temp 98.1°F | Resp 15 | Ht 72.0 in | Wt 187.0 lb

## 2022-11-28 DIAGNOSIS — D128 Benign neoplasm of rectum: Secondary | ICD-10-CM

## 2022-11-28 DIAGNOSIS — K635 Polyp of colon: Secondary | ICD-10-CM | POA: Diagnosis not present

## 2022-11-28 DIAGNOSIS — D122 Benign neoplasm of ascending colon: Secondary | ICD-10-CM

## 2022-11-28 DIAGNOSIS — Z1211 Encounter for screening for malignant neoplasm of colon: Secondary | ICD-10-CM

## 2022-11-28 DIAGNOSIS — K621 Rectal polyp: Secondary | ICD-10-CM

## 2022-11-28 DIAGNOSIS — K649 Unspecified hemorrhoids: Secondary | ICD-10-CM

## 2022-11-28 DIAGNOSIS — D120 Benign neoplasm of cecum: Secondary | ICD-10-CM | POA: Diagnosis not present

## 2022-11-28 DIAGNOSIS — Z800 Family history of malignant neoplasm of digestive organs: Secondary | ICD-10-CM

## 2022-11-28 MED ORDER — SODIUM CHLORIDE 0.9 % IV SOLN: 500.0000 mL | Freq: Once | INTRAVENOUS | Status: DC

## 2022-11-28 NOTE — Progress Notes (Signed)
Called to room to assist during endoscopic procedure.  Patient ID and intended procedure confirmed with present staff. Received instructions for my participation in the procedure from the performing physician.  

## 2022-11-28 NOTE — Patient Instructions (Signed)
Resume previous diet and medications. Awaiting pathology results. Repeat Colonoscopy date to be determined based on pathology results.  Handouts provided on Colon polyps and Hemorrhoids  YOU HAD AN ENDOSCOPIC PROCEDURE TODAY AT THE [facility]:   Refer to the procedure report that was given to you for any specific questions about what was found during the examination.  If the procedure report does not answer your questions, please call your gastroenterologist to clarify.  If you requested that your care partner not be given the details of your procedure findings, then the procedure report has been included in a sealed envelope for you to review at your convenience later.  YOU SHOULD EXPECT: Some feelings of bloating in the abdomen. Passage of more gas than usual.  Walking can help get rid of the air that was put into your GI tract during the procedure and reduce the bloating. If you had a lower endoscopy (such as a colonoscopy or flexible sigmoidoscopy) you may notice spotting of blood in your stool or on the toilet paper. If you underwent a bowel prep for your procedure, you may not have a normal bowel movement for a few days.  Please Note:  You might notice some irritation and congestion in your nose or some drainage.  This is from the oxygen used during your procedure.  There is no need for concern and it should clear up in a day or so.  SYMPTOMS TO REPORT IMMEDIATELY:  Following lower endoscopy (colonoscopy or flexible sigmoidoscopy):  Excessive amounts of blood in the stool  Significant tenderness or worsening of abdominal pains  Swelling of the abdomen that is new, acute  Fever of 100F or higher   For urgent or emergent issues, a gastroenterologist can be reached at any hour by calling [phone]. Do not use MyChart messaging for urgent concerns.    DIET:  We do recommend a small meal at first, but then you may proceed to your regular diet.  Drink plenty of fluids but  you should avoid alcoholic beverages for 24 hours.  ACTIVITY:  You should plan to take it easy for the rest of today and you should NOT DRIVE or use heavy machinery until tomorrow (because of the sedation medicines used during the test).    FOLLOW UP: Our staff will call the number listed on your records the next business day following your procedure.  We will call around 7:15- 8:00 am to check on you and address any questions or concerns that you may have regarding the information given to you following your procedure. If we do not reach you, we will leave a message.     If any biopsies were taken you will be contacted by phone or by letter within the next 1-3 weeks.  Please call us at [phone] if you have not heard about the biopsies in 3 weeks.    SIGNATURES/CONFIDENTIALITY: You and/or your care partner have signed paperwork which will be entered into your electronic medical record.  These signatures attest to the fact that that the information above on your After Visit Summary has been reviewed and is understood.  Full responsibility of the confidentiality of this discharge information lies with you and/or your care-partner. 

## 2022-11-28 NOTE — Progress Notes (Signed)
GASTROENTEROLOGY PROCEDURE H&P NOTE   Primary Care Physician: [place], Betty G, MD    Reason for Procedure:   Family hx of colon polyps  Plan:    colonoscopy  Patient is appropriate for endoscopic procedure(s) in the ambulatory ([facility]) setting.  The nature of the procedure, as well as the risks, benefits, and alternatives were carefully and thoroughly reviewed with the patient. Ample time for discussion and questions allowed. The patient understood, was satisfied, and agreed to proceed.     HPI: Franklin Baker is a 67 y.o. male who presents for colonoscopy.  Medical history as below.  Tolerated the prep.  No recent chest pain or shortness of breath.  No abdominal pain today.  Past Medical History:  Diagnosis Date   Allergy    Anxiety    Cataract    early cataracts    Hyperlipidemia    Vertigo, benign positional     Past Surgical History:  Procedure Laterality Date   CATARACT EXTRACTION, BILATERAL Bilateral 2020   COLONOSCOPY  2019   [id number](good)-normal   COLONOSCOPY  2014   normal    Prior to Admission medications   Medication Sig Start Date End Date Taking? Authorizing Provider  carbamide peroxide (DEBROX) 6.5 % OTIC solution Place 5 drops into the left ear 2 (two) times daily. 11/11/20  Yes [place], Betty G, MD  clindamycin-benzoyl peroxide (BENZACLIN) gel APPLY TOPICALLY TO THE AFFECTED AREA AS NEEDED 04/02/22  Yes [place], Betty G, MD  diazepam (VALIUM) 5 MG tablet TAKE 1 TABLET BY MOUTH EVERY 12 HOURS AS NEEDED FOR ANXIETY. NOT TO EXCEED 40 TABLETS PER MONTH 10/02/22  Yes [place], Betty G, MD  Omega-3 Fatty Acids (FISH OIL TRIPLE STRENGTH) 1400 MG CAPS Take 1 capsule by mouth daily.   Yes [provider]  simvastatin (ZOCOR) 40 MG tablet TAKE 1 TABLET(40 MG) BY MOUTH AT BEDTIME 11/15/22  Yes [place], Betty G, MD  sildenafil (VIAGRA) 100 MG tablet TAKE 1 TABLET BY MOUTH DAILY AS NEEDED FOR ERECTILE DYSFUNCTION 11/24/21   [place], Betty G, MD    Current  Outpatient Medications  Medication Sig Dispense Refill   carbamide peroxide (DEBROX) 6.5 % OTIC solution Place 5 drops into the left ear 2 (two) times daily. 15 mL 0   clindamycin-benzoyl peroxide (BENZACLIN) gel APPLY TOPICALLY TO THE AFFECTED AREA AS NEEDED 100 g 2   diazepam (VALIUM) 5 MG tablet TAKE 1 TABLET BY MOUTH EVERY 12 HOURS AS NEEDED FOR ANXIETY. NOT TO EXCEED 40 TABLETS PER MONTH 40 tablet 3   Omega-3 Fatty Acids (FISH OIL TRIPLE STRENGTH) 1400 MG CAPS Take 1 capsule by mouth daily.     simvastatin (ZOCOR) 40 MG tablet TAKE 1 TABLET(40 MG) BY MOUTH AT BEDTIME 90 tablet 3   sildenafil (VIAGRA) 100 MG tablet TAKE 1 TABLET BY MOUTH DAILY AS NEEDED FOR ERECTILE DYSFUNCTION 18 tablet 5   Current Facility-Administered Medications  Medication Dose Route Frequency Provider Last Rate Last Admin   0.9 %  sodium chloride infusion  500 mL Intravenous Once Pheonix Wisby, Carie Caddy, MD        Allergies as of 11/28/2022   (No Known Allergies)    Family History  Problem Relation Age of Onset   Breast cancer Mother    Colon polyps Sister 62   Colon cancer Sister 61       died age 23   Esophageal cancer Neg Hx    Rectal cancer Neg Hx    Stomach cancer Neg Hx  Social History   Socioeconomic History   Marital status: Married    Spouse name: Not on file   Number of children: Not on file   Years of education: Not on file   Highest education level: Some college, no degree  Occupational History   Not on file  Tobacco Use   Smoking status: Never   Smokeless tobacco: Never  Vaping Use   Vaping status: Never Used  Substance and Sexual Activity   Alcohol use: Yes    Comment: occasionally    Drug use: No   Sexual activity: Not on file  Other Topics Concern   Not on file  Social History Narrative   Not on file   Social Determinants of Health   Financial Resource Strain: Low Risk  (11/11/2022)   Overall Financial Resource Strain (CARDIA)    Difficulty of Paying Living Expenses: Not  hard at all  Food Insecurity: No Food Insecurity (11/11/2022)   Hunger Vital Sign    Worried About Running Out of Food in the Last Year: Never true    Ran Out of Food in the Last Year: Never true  Transportation Needs: No Transportation Needs (05/15/2022)   PRAPARE - Administrator, Civil Service (Medical): No    Lack of Transportation (Non-Medical): No  Physical Activity: Sufficiently Active (11/11/2022)   Exercise Vital Sign    Days of Exercise per Week: 4 days    Minutes of Exercise per Session: 90 min  Stress: No Stress Concern Present (11/11/2022)   [employer] of Occupational Health - Occupational Stress Questionnaire    Feeling of Stress : Not at all  Social Connections: Unknown (11/11/2022)   Social Connection and Isolation Panel [NHANES]    Frequency of Communication with Friends and Family: More than three times a week    Frequency of Social Gatherings with Friends and Family: Twice a week    Attends Religious Services: More than 4 times per year    Active Member of [employer] or Organizations: Patient declined    Attends Engineer, structural: Not on file    Marital Status: Married  Catering manager Violence: Not on file    Physical Exam: Vital signs in last 24 hours: @BP  (!) 176/97   Pulse 61   Temp 98.1 F (36.7 C)   Ht 6' (1.829 m)   Wt 187 lb (84.8 kg)   SpO2 98%   BMI 25.36 kg/m  GEN: NAD EYE: Sclerae anicteric ENT: MMM CV: Non-tachycardic Pulm: CTA b/l GI: Soft, NT/ND NEURO:  Alert & Oriented x 3   Erick Blinks, MD [facility]  11/28/2022 10:59 AM

## 2022-11-28 NOTE — Op Note (Signed)
Franklin Baker Patient Name: Franklin Baker Procedure Date: 11/28/2022 10:58 AM MRN: [id number] Endoscopist: Beverley Fiedler , MD, [id number] Age: 67 Referring MD:  Date of Birth: 08-06-1955 Gender: Male Account #: [id number] Procedure:                Colonoscopy Indications:              Screening in patient at increased risk: Family                            history of 1st-degree relative with colorectal                            cancer before age 40 years (sister), Last                            colonoscopy: November 2019 Medicines:                Monitored Anesthesia Care Procedure:                Pre-Anesthesia Assessment:                           - Prior to the procedure, a History and Physical                            was performed, and patient medications and                            allergies were reviewed. The patient's tolerance of                            previous anesthesia was also reviewed. The risks                            and benefits of the procedure and the sedation                            options and risks were discussed with the patient.                            All questions were answered, and informed consent                            was obtained. Prior Anticoagulants: The patient has                            taken no anticoagulant or antiplatelet agents. ASA                            Grade Assessment: II - A patient with mild systemic                            disease. After reviewing the risks and benefits,  the patient was deemed in satisfactory condition to                            undergo the procedure.                           After obtaining informed consent, the colonoscope                            was passed under direct vision. Throughout the                            procedure, the patient's blood pressure, pulse, and                            oxygen saturations were monitored continuously.  The                            CF [id number] #[id number] was introduced through the anus                            and advanced to the cecum, identified by                            appendiceal orifice and ileocecal valve. The                            colonoscopy was performed without difficulty. The                            patient tolerated the procedure well. The quality                            of the bowel preparation was good. The ileocecal                            valve, appendiceal orifice, and rectum were                            photographed. Scope In: 11:07:38 AM Scope Out: 11:23:25 AM Scope Withdrawal Time: 0 hours 11 minutes 32 seconds  Total Procedure Duration: 0 hours 15 minutes 47 seconds  Findings:                 The digital rectal exam was normal.                           A 4 mm polyp was found in the cecum. The polyp was                            sessile. The polyp was removed with a cold snare.                            Resection and retrieval were complete.  A 4 mm polyp was found in the ascending colon. The                            polyp was sessile. The polyp was removed with a                            cold snare. Resection and retrieval were complete.                           A 4 mm polyp was found in the rectum. The polyp was                            sessile. The polyp was removed with a cold snare.                            Resection and retrieval were complete.                           Internal hemorrhoids were found during                            retroflexion. The hemorrhoids were small. Complications:            No immediate complications. Estimated Blood Loss:     Estimated blood loss was minimal. Impression:               - One 4 mm polyp in the cecum, removed with a cold                            snare. Resected and retrieved.                           - One 4 mm polyp in the ascending colon, removed                             with a cold snare. Resected and retrieved.                           - One 4 mm polyp in the rectum, removed with a cold                            snare. Resected and retrieved.                           - Small internal hemorrhoids. Recommendation:           - Patient has a contact number available for                            emergencies. The signs and symptoms of potential                            delayed complications were discussed with the  patient. Return to normal activities tomorrow.                            Written discharge instructions were provided to the                            patient.                           - Resume previous diet.                           - Continue present medications.                           - Await pathology results.                           - Repeat colonoscopy is recommended. The                            colonoscopy date will be determined after pathology                            results from today's exam become available for                            review. Beverley Fiedler, MD 11/28/2022 11:26:47 AM This report has been signed electronically.

## 2022-11-28 NOTE — Progress Notes (Signed)
Pt's states no medical or surgical changes since previsit or office visit. 

## 2022-11-28 NOTE — Progress Notes (Signed)
Report to PACU, RN, vss, BBS= Clear.  

## 2022-11-29 ENCOUNTER — Telehealth: Payer: Self-pay | Admitting: *Deleted

## 2022-11-29 NOTE — Telephone Encounter (Signed)
  Follow up Call-     11/28/2022   10:30 AM  Call back number  Post procedure Call Back phone  # [phone]  Permission to leave phone message Yes     Patient questions:  Do you have a fever, pain , or abdominal swelling? No. Pain Score  0 *  Have you tolerated food without any problems? Yes.    Have you been able to return to your normal activities? Yes.    Do you have any questions about your discharge instructions: Diet   No. Medications  No. Follow up visit  No.  Do you have questions or concerns about your Care? No.  Actions: * If pain score is 4 or above: No action needed, pain <4.

## 2022-12-03 LAB — SURGICAL PATHOLOGY

## 2022-12-04 ENCOUNTER — Encounter: Payer: Self-pay | Admitting: Internal Medicine

## 2022-12-10 ENCOUNTER — Other Ambulatory Visit: Payer: Self-pay | Admitting: Family Medicine

## 2022-12-10 DIAGNOSIS — N529 Male erectile dysfunction, unspecified: Secondary | ICD-10-CM

## 2023-01-29 ENCOUNTER — Other Ambulatory Visit: Payer: Self-pay | Admitting: Family Medicine

## 2023-01-29 DIAGNOSIS — H8110 Benign paroxysmal vertigo, unspecified ear: Secondary | ICD-10-CM

## 2023-01-29 DIAGNOSIS — F419 Anxiety disorder, unspecified: Secondary | ICD-10-CM

## 2023-04-18 ENCOUNTER — Other Ambulatory Visit: Payer: Self-pay | Admitting: Family Medicine

## 2023-04-18 DIAGNOSIS — L700 Acne vulgaris: Secondary | ICD-10-CM

## 2023-05-14 ENCOUNTER — Ambulatory Visit: Payer: No Typology Code available for payment source | Admitting: Family Medicine

## 2023-05-28 ENCOUNTER — Other Ambulatory Visit: Payer: Self-pay | Admitting: Family Medicine

## 2023-05-28 DIAGNOSIS — F419 Anxiety disorder, unspecified: Secondary | ICD-10-CM

## 2023-05-28 DIAGNOSIS — H8110 Benign paroxysmal vertigo, unspecified ear: Secondary | ICD-10-CM

## 2023-08-27 ENCOUNTER — Ambulatory Visit (INDEPENDENT_AMBULATORY_CARE_PROVIDER_SITE_OTHER): Admitting: Family Medicine

## 2023-08-27 ENCOUNTER — Encounter: Payer: Self-pay | Admitting: Family Medicine

## 2023-08-27 VITALS — BP 120/80 | HR 76 | Temp 98.1°F | Resp 16 | Ht 72.0 in | Wt 195.0 lb

## 2023-08-27 DIAGNOSIS — R7303 Prediabetes: Secondary | ICD-10-CM

## 2023-08-27 DIAGNOSIS — Z0000 Encounter for general adult medical examination without abnormal findings: Secondary | ICD-10-CM

## 2023-08-27 DIAGNOSIS — R9431 Abnormal electrocardiogram [ECG] [EKG]: Secondary | ICD-10-CM | POA: Diagnosis not present

## 2023-08-27 DIAGNOSIS — F419 Anxiety disorder, unspecified: Secondary | ICD-10-CM

## 2023-08-27 DIAGNOSIS — E785 Hyperlipidemia, unspecified: Secondary | ICD-10-CM | POA: Diagnosis not present

## 2023-08-27 DIAGNOSIS — H8110 Benign paroxysmal vertigo, unspecified ear: Secondary | ICD-10-CM

## 2023-08-27 LAB — COMPREHENSIVE METABOLIC PANEL WITH GFR
ALT: 25 U/L (ref 0–53)
AST: 24 U/L (ref 0–37)
Albumin: 4.5 g/dL (ref 3.5–5.2)
Alkaline Phosphatase: 63 U/L (ref 39–117)
BUN: 16 mg/dL (ref 6–23)
CO2: 26 meq/L (ref 19–32)
Calcium: 9.4 mg/dL (ref 8.4–10.5)
Chloride: 104 meq/L (ref 96–112)
Creatinine, Ser: 1.12 mg/dL (ref 0.40–1.50)
GFR: 68.8 mL/min (ref >60.00)
Glucose, Bld: 97 mg/dL (ref 70–99)
Potassium: 3.9 meq/L (ref 3.5–5.1)
Sodium: 139 meq/L (ref 135–145)
Total Bilirubin: 0.7 mg/dL (ref 0.2–1.2)
Total Protein: 6.8 g/dL (ref 6.0–8.3)

## 2023-08-27 LAB — LIPID PANEL
Cholesterol: 189 mg/dL (ref 0–200)
HDL: 47.6 mg/dL (ref >39.00)
LDL Cholesterol: 118 mg/dL — ABNORMAL HIGH (ref 0–99)
NonHDL: 141.77
Total CHOL/HDL Ratio: 4
Triglycerides: 119 mg/dL (ref 0.0–149.0)
VLDL: 23.8 mg/dL (ref 0.0–40.0)

## 2023-08-27 LAB — HEMOGLOBIN A1C: Hgb A1c MFr Bld: 6.5 % (ref 4.6–6.5)

## 2023-08-27 NOTE — Assessment & Plan Note (Signed)
 Problem is well controlled, stable. Continue Diazepam  5 mg bid prn. Fall precautions discussed. F/U in 6 months.

## 2023-08-27 NOTE — Progress Notes (Unsigned)
 HPI: [name] is a 68 y.o.male with a PMHx significant for vertigo, HLD, prediabetes, and anxiety, here today for his Medicare Wellness Visit.  Last CPE: 11/17/2021  Pt has been established with Dr. Renda of urology at [facility] for several years.  Was last seen 06/2023.   Exercise: goes tot he gym 3-4 days/week. Diet: Generally eating a healthy diet.   Dental: UTD with routine dental care.  Vision: UTD with routine vision exams; last seen 01/2023.  Pt is able to drive, ambulate, manage finances, and complete daily tasks at home independently without any required assistance.  Does not currently have power of attorney or living will.   Chronic medical problems:   Hyperlipidemia Currently on Simvastatin  40 mg once daily and omega-3 fish oil daily.  Good compliance/tolerance.  Has been on this current medication regimen for some time now.  Pt is following a low fat diet. Reports a fmhx of HLD.  Lab Results  Component Value Date   CHOL 188 11/14/2022   HDL 52.70 11/14/2022   LDLCALC 107 (H) 11/14/2022   TRIG 143.0 11/14/2022   CHOLHDL 4 11/14/2022   Currently on Valium  5 mg every 12 hrs as needed for anxiety and vertigo. Also on topical Benzaclin as needed for vertigo.   Immunization History  Administered Date(s) Administered   Influenza,inj,Quad PF,6+ Mos 12/28/2016, 12/23/2017, 10/16/2018, 11/10/2019, 11/11/2020   Influenza-Unspecified 09/29/2021   PFIZER Comirnaty(Gray Top)Covid-19 Tri-Sucrose Vaccine 11/30/2019, 09/27/2020, 09/29/2021   PFIZER(Purple Top)SARS-COV-2 Vaccination 04/10/2019, 05/05/2019   Tdap 11/17/2021   Zoster Recombinant(Shingrix ) 06/06/2018, 08/08/2018   Health Maintenance  Topic Date Due   COVID-19 Vaccine (6 - 2024-25 season) 09/11/2023 (Originally 09/09/2022)   Pneumococcal Vaccine: 50+ Years (1 of 1 - PCV) 11/15/2023 (Originally 11/07/2007)   INFLUENZA VACCINE  04/07/2024 (Originally 08/09/2023)   Medicare Annual Wellness (AWV)   08/26/2024   Colonoscopy  11/28/2027   DTaP/Tdap/Td (2 - Td or Tdap) 11/18/2031   Hepatitis C Screening  Completed   HIV Screening  Completed   Zoster Vaccines- Shingrix   Completed   Hepatitis B Vaccines 19-59 Average Risk  Aged Out   HPV VACCINES  Aged Out   Meningococcal B Vaccine  Aged Out   Functional Status Survey: Is the patient deaf or have difficulty hearing?: No Does the patient have difficulty seeing, even when wearing glasses/contacts?: No Does the patient have difficulty concentrating, remembering, or making decisions?: No Does the patient have difficulty walking or climbing stairs?: No Does the patient have difficulty dressing or bathing?: No Does the patient have difficulty doing errands alone such as visiting a doctor's office or shopping?: No     08/27/2023    8:30 AM 11/11/2022    4:28 PM 11/17/2021    8:56 AM 05/12/2021    9:04 AM  Fall Risk   Falls in the past year? 0 0 0 0  Number falls in past yr: 0  0 0  Injury with Fall? 0  0 0  Risk for fall due to : Other (Comment)  No Fall Risks No Fall Risks  Follow up Falls evaluation completed;Education provided  Falls evaluation completed  Falls evaluation completed      Data saved with a previous flowsheet row definition   Providers *** sees regularly:  Eye care provider: ***     08/27/2023    8:30 AM  Depression screen PHQ 2/9  Decreased Interest 0  Down, Depressed, Hopeless 0  PHQ - 2 Score 0  Mini-Cog - 08/27/23 0847     Normal clock drawing test? yes    How many words correct? 3         Hearing Screening   500Hz  1000Hz  2000Hz  4000Hz   Right ear Pass Pass Pass Pass  Left ear Fail Pass Pass Pass   Vision Screening   Right eye Left eye Both eyes  Without correction 20/20 20/20 20/20   With correction      -No acute concerns today.   Review of Systems  Constitutional:  Negative for activity change, appetite change, fatigue and fever.  HENT:  Negative for sore throat and trouble swallowing.    Eyes:  Negative for redness and visual disturbance.  Respiratory:  Negative for cough, shortness of breath and wheezing.   Cardiovascular:  Negative for chest pain, palpitations and leg swelling.  Gastrointestinal:  Negative for abdominal pain, nausea and vomiting.  Endocrine: Negative for cold intolerance and heat intolerance.  Genitourinary:  Negative for decreased urine volume, dysuria and hematuria.  Allergic/Immunologic: Positive for environmental allergies.  Neurological:  Negative for syncope, facial asymmetry, weakness and headaches.  Psychiatric/Behavioral:  Negative for confusion and hallucinations.    Current Outpatient Medications on File Prior to Visit  Medication Sig Dispense Refill   carbamide peroxide (DEBROX) 6.5 % OTIC solution Place 5 drops into the left ear 2 (two) times daily. 15 mL 0   clindamycin -benzoyl peroxide (BENZACLIN) gel APPLY TOPICALLY TO THE AFFECTED AREA AS NEEDED 100 g 2   diazepam  (VALIUM ) 5 MG tablet TAKE 1 TABLET BY MOUTH EVERY 12 HOURS AS NEEDED FOR ANXIETY. NOT TO EXCEED 40 TABLETS PER MONTH 40 tablet 3   Omega-3 Fatty Acids (FISH OIL TRIPLE STRENGTH) 1400 MG CAPS Take 1 capsule by mouth daily.     sildenafil  (VIAGRA ) 100 MG tablet TAKE 1 TABLET BY MOUTH DAILY AS NEEDED FOR ERECTILE DYSFUNCTION 18 tablet 5   simvastatin  (ZOCOR ) 40 MG tablet TAKE 1 TABLET(40 MG) BY MOUTH AT BEDTIME 90 tablet 3   No current facility-administered medications on file prior to visit.   Past Medical History:  Diagnosis Date   Allergy    Anxiety    Cataract    early cataracts    Hyperlipidemia    Vertigo, benign positional    Past Surgical History:  Procedure Laterality Date   CATARACT EXTRACTION, BILATERAL Bilateral 2020   COLONOSCOPY  2019   DJ-MAC-goly(good)-normal   COLONOSCOPY  2014   normal   EYE SURGERY     No Known Allergies  Family History  Problem Relation Age of Onset   Breast cancer Mother    Cancer Mother    Colon polyps Sister 32   Colon  cancer Sister 48       died age 24   Esophageal cancer Neg Hx    Rectal cancer Neg Hx    Stomach cancer Neg Hx     Social History   Socioeconomic History   Marital status: Married    Spouse name: Not on file   Number of children: Not on file   Years of education: Not on file   Highest education level: Some college, no degree  Occupational History   Not on file  Tobacco Use   Smoking status: Never   Smokeless tobacco: Never  Vaping Use   Vaping status: Never Used  Substance and Sexual Activity   Alcohol use: Not Currently    Comment: occasionally    Drug use: No   Sexual activity: Yes  Other Topics  Concern   Not on file  Social History Narrative   Not on file   Social Drivers of Health   Financial Resource Strain: Low Risk  (08/20/2023)   Overall Financial Resource Strain (CARDIA)    Difficulty of Paying Living Expenses: Not hard at all  Food Insecurity: Unknown (08/20/2023)   Hunger Vital Sign    Worried About Running Out of Food in the Last Year: Never true    Ran Out of Food in the Last Year: Not on file  Transportation Needs: No Transportation Needs (08/20/2023)   PRAPARE - Administrator, Civil Service (Medical): No    Lack of Transportation (Non-Medical): No  Physical Activity: Sufficiently Active (08/20/2023)   Exercise Vital Sign    Days of Exercise per Week: 4 days    Minutes of Exercise per Session: 90 min  Stress: Patient Declined (08/20/2023)   [employer] of Occupational Health - Occupational Stress Questionnaire    Feeling of Stress: Patient declined  Social Connections: Socially Integrated (08/20/2023)   Social Connection and Isolation Panel    Frequency of Communication with Friends and Family: More than three times a week    Frequency of Social Gatherings with Friends and Family: Once a week    Attends Religious Services: More than 4 times per year    Active Member of [employer] or Organizations: Yes    Attends Banker  Meetings: Not on file    Marital Status: Married   Vitals:   08/27/23 0823  BP: 120/80  Pulse: 76  Resp: 16  Temp: 98.1 F (36.7 C)  SpO2: 98%   Body mass index is 26.45 kg/m.  Wt Readings from Last 3 Encounters:  08/27/23 195 lb (88.5 kg)  11/28/22 187 lb (84.8 kg)  11/14/22 188 lb 2 oz (85.3 kg)   Physical Exam Vitals and nursing note reviewed.  Constitutional:      General: He is not in acute distress.    Appearance: He is well-developed.  HENT:     Head: Normocephalic and atraumatic.     Mouth/Throat:     Mouth: Mucous membranes are moist.     Pharynx: Oropharynx is clear.  Eyes:     Conjunctiva/sclera: Conjunctivae normal.  Cardiovascular:     Rate and Rhythm: Normal rate and regular rhythm.     Pulses:          Posterior tibial pulses are 2+ on the right side and 2+ on the left side.     Heart sounds: No murmur heard. Pulmonary:     Effort: Pulmonary effort is normal. No respiratory distress.     Breath sounds: Normal breath sounds.  Abdominal:     Palpations: Abdomen is soft. There is no hepatomegaly or mass.     Tenderness: There is no abdominal tenderness.  Lymphadenopathy:     Cervical: No cervical adenopathy.  Skin:    General: Skin is warm.     Findings: No erythema.  Neurological:     General: No focal deficit present.     Mental Status: He is alert and oriented to person, place, and time.     Gait: Gait normal.  Psychiatric:        Mood and Affect: Mood and affect normal.   ASSESSMENT AND PLAN: [name] was seen today for a Medicare Wellness Visit.  Orders Placed This Encounter  Procedures   Comprehensive metabolic panel with GFR   Hemoglobin A1c   Lipid panel  EKG 12-Lead    Medicare annual wellness visit, initial We discussed the importance of staying active, physically and mentally, as well as the benefits of a healthy/balance diet. Low impact exercise that involve stretching and strengthing are ideal. Vaccines up to  date. We discussed preventive screening for the next 5-10 years, summery of recommendations given in AVS. Fall prevention. Advance directives and end of life discussed, he does not have POA or living will. Recommend to consider going through health directive package given today, I asked for a copy after compete it and notarized.   Prediabetes Assessment & Plan: Last hemoglobin A1c 6.1 in 11/2022. Encouraged consistency with a healthy life style for diabetes prevention. Further recommendations according to HgA1C result.  Orders: -     Hemoglobin A1c; Future  Hyperlipidemia, unspecified hyperlipidemia type Assessment & Plan: Last LDL 107 in 11/2022. Continue Simvastatin  40 mg daily and low fat diet. We discussed LDL goals. Further recommendation will be given according to results.  Orders: -     Comprehensive metabolic panel with GFR; Future -     Lipid panel; Future  Abnormal EKG *** -     EKG 12-Lead  Benign paroxysmal positional vertigo, unspecified laterality Assessment & Plan: Problem is well controlled, stable. Continue Diazepam  5 mg bid prn. Fall precautions discussed. F/U in 6 months.  Anxiety disorder, unspecified type Assessment & Plan: Problem is stable. Continue diltiazem 5 mg twice daily as needed. Follow-up in 6 months.  Return in about 6 months (around 02/27/2024) for chronic problems.  I, Vernell Forest, acting as a scribe for Renesmay Nesbitt [place], MD., have documented all relevant documentation on the behalf of Franklin Rostron [place], MD, as directed by   while in the presence of Atticus Wedin [place], MD.  I, Niaja Stickley [place], MD, have reviewed all documentation for this visit. The documentation on 08/27/23 for the exam, diagnosis, procedures, and orders are all accurate and complete.   Fallou Hulbert G. [place], MD  [facility]

## 2023-08-27 NOTE — Assessment & Plan Note (Signed)
 Last LDL 107 in 11/2022. Continue Simvastatin  40 mg daily and low fat diet. We discussed LDL goals. Further recommendation will be given according to results.

## 2023-08-27 NOTE — Assessment & Plan Note (Signed)
 Problem is stable. Continue diltiazem 5 mg twice daily as needed. Follow-up in 6 months.

## 2023-08-27 NOTE — Patient Instructions (Addendum)
  Franklin Baker , Thank you for taking time to come for your Medicare Wellness Visit. I appreciate your ongoing commitment to your health goals. Please review the following plan we discussed and let me know if I can assist you in the future.   These are the goals we discussed:  Goals       Acknowledge receipt of Advanced Directive package        This is a list of the screening recommended for you and due dates:  Health Maintenance  Topic Date Due   COVID-19 Vaccine (6 - 2024-25 season) 09/11/2023*   Pneumococcal Vaccine for age over 40 (1 of 1 - PCV) 11/15/2023*   Flu Shot  04/07/2024*   Medicare Annual Wellness Visit  08/26/2024   Colon Cancer Screening  11/28/2027   DTaP/Tdap/Td vaccine (2 - Td or Tdap) 11/18/2031   Hepatitis C Screening  Completed   HIV Screening  Completed   Zoster (Shingles) Vaccine  Completed   Hepatitis B Vaccine  Aged Out   HPV Vaccine  Aged Out   Meningitis B Vaccine  Aged Out  *Topic was postponed. The date shown is not the original due date.   A few things to remember from today's visit:  Medicare annual wellness visit, initial  Anxiety disorder, unspecified type  Hyperlipidemia, unspecified hyperlipidemia type - Plan: Comprehensive metabolic panel with GFR, Lipid panel  Abnormal EKG - Plan: EKG 12-Lead  Prediabetes - Plan: Hemoglobin A1c  Benign paroxysmal positional vertigo, unspecified laterality  If you need refills for medications you take chronically, please call your pharmacy. Do not use My Chart to request refills or for acute issues that need immediate attention. If you send a my chart message, it may take a few days to be addressed, specially if I am not in the office.  Please be sure medication list is accurate. If a new problem present, please set up appointment sooner than planned today.

## 2023-08-27 NOTE — Assessment & Plan Note (Signed)
 Last hemoglobin A1c 6.1 in 11/2022. Encouraged consistency with a healthy life style for diabetes prevention. Further recommendations according to HgA1C result.

## 2023-08-29 ENCOUNTER — Ambulatory Visit: Payer: Self-pay | Admitting: Family Medicine

## 2023-10-06 ENCOUNTER — Other Ambulatory Visit: Payer: Self-pay | Admitting: Family Medicine

## 2023-10-06 DIAGNOSIS — H8110 Benign paroxysmal vertigo, unspecified ear: Secondary | ICD-10-CM

## 2023-10-06 DIAGNOSIS — F419 Anxiety disorder, unspecified: Secondary | ICD-10-CM

## 2023-11-23 ENCOUNTER — Other Ambulatory Visit: Payer: Self-pay | Admitting: Family Medicine

## 2023-11-23 DIAGNOSIS — E785 Hyperlipidemia, unspecified: Secondary | ICD-10-CM

## 2023-12-15 ENCOUNTER — Other Ambulatory Visit: Payer: Self-pay | Admitting: Family Medicine

## 2023-12-15 DIAGNOSIS — N529 Male erectile dysfunction, unspecified: Secondary | ICD-10-CM

## 2024-02-06 ENCOUNTER — Other Ambulatory Visit: Payer: Self-pay | Admitting: Family Medicine

## 2024-02-06 DIAGNOSIS — F419 Anxiety disorder, unspecified: Secondary | ICD-10-CM

## 2024-02-06 DIAGNOSIS — H8110 Benign paroxysmal vertigo, unspecified ear: Secondary | ICD-10-CM

## 2024-03-02 ENCOUNTER — Ambulatory Visit: Admitting: Family Medicine
